# Patient Record
Sex: Female | Born: 1935 | Hispanic: Yes | Smoking: Never smoker
Health system: Southern US, Community
[De-identification: ages and names within clinical notes are randomized; demographics above are authoritative.]

## PROBLEM LIST (undated history)

## (undated) DIAGNOSIS — I10 Essential (primary) hypertension: Secondary | ICD-10-CM

---

## 2019-11-22 ENCOUNTER — Emergency Department (HOSPITAL_COMMUNITY): Payer: Self-pay

## 2019-11-22 ENCOUNTER — Inpatient Hospital Stay (HOSPITAL_COMMUNITY)
Admission: EM | Admit: 2019-11-22 | Discharge: 2019-11-24 | DRG: 087 | Disposition: A | Discharge status: Rehabilitation Facility | Payer: Self-pay | Attending: Internal Medicine | Admitting: Internal Medicine

## 2019-11-22 ENCOUNTER — Encounter (HOSPITAL_COMMUNITY): Payer: Self-pay | Admitting: Internal Medicine

## 2019-11-22 DIAGNOSIS — I10 Essential (primary) hypertension: Secondary | ICD-10-CM | POA: Diagnosis present

## 2019-11-22 DIAGNOSIS — S0101XA Laceration without foreign body of scalp, initial encounter: Secondary | ICD-10-CM | POA: Diagnosis present

## 2019-11-22 DIAGNOSIS — Z79899 Other long term (current) drug therapy: Secondary | ICD-10-CM

## 2019-11-22 DIAGNOSIS — W108XXA Fall (on) (from) other stairs and steps, initial encounter: Secondary | ICD-10-CM

## 2019-11-22 DIAGNOSIS — S065XAA Traumatic subdural hemorrhage with loss of consciousness status unknown, initial encounter: Secondary | ICD-10-CM | POA: Diagnosis present

## 2019-11-22 DIAGNOSIS — Z23 Encounter for immunization: Secondary | ICD-10-CM

## 2019-11-22 DIAGNOSIS — H532 Diplopia: Secondary | ICD-10-CM | POA: Diagnosis present

## 2019-11-22 DIAGNOSIS — R402142 Coma scale, eyes open, spontaneous, at arrival to emergency department: Secondary | ICD-10-CM | POA: Diagnosis present

## 2019-11-22 DIAGNOSIS — W109XXA Fall (on) (from) unspecified stairs and steps, initial encounter: Secondary | ICD-10-CM | POA: Diagnosis present

## 2019-11-22 DIAGNOSIS — Z20822 Contact with and (suspected) exposure to covid-19: Secondary | ICD-10-CM | POA: Diagnosis present

## 2019-11-22 DIAGNOSIS — K219 Gastro-esophageal reflux disease without esophagitis: Secondary | ICD-10-CM | POA: Diagnosis present

## 2019-11-22 DIAGNOSIS — M4316 Spondylolisthesis, lumbar region: Secondary | ICD-10-CM | POA: Diagnosis present

## 2019-11-22 DIAGNOSIS — R402362 Coma scale, best motor response, obeys commands, at arrival to emergency department: Secondary | ICD-10-CM | POA: Diagnosis present

## 2019-11-22 DIAGNOSIS — S065X1A Traumatic subdural hemorrhage with loss of consciousness of 30 minutes or less, initial encounter: Principal | ICD-10-CM | POA: Diagnosis present

## 2019-11-22 DIAGNOSIS — R32 Unspecified urinary incontinence: Secondary | ICD-10-CM | POA: Diagnosis present

## 2019-11-22 DIAGNOSIS — D649 Anemia, unspecified: Secondary | ICD-10-CM | POA: Diagnosis present

## 2019-11-22 DIAGNOSIS — R402252 Coma scale, best verbal response, oriented, at arrival to emergency department: Secondary | ICD-10-CM | POA: Diagnosis present

## 2019-11-22 DIAGNOSIS — G629 Polyneuropathy, unspecified: Secondary | ICD-10-CM | POA: Diagnosis present

## 2019-11-22 LAB — CBC WITH DIFFERENTIAL/PLATELET
Abs Immature Granulocytes: 0.03 10*3/uL (ref 0.00–0.07)
Basophils Absolute: 0 10*3/uL (ref 0.0–0.1)
Basophils Relative: 0 %
Eosinophils Absolute: 0.1 10*3/uL (ref 0.0–0.5)
Eosinophils Relative: 1 %
HCT: 35.8 % — ABNORMAL LOW (ref 36.0–46.0)
Hemoglobin: 10.8 g/dL — ABNORMAL LOW (ref 12.0–15.0)
Immature Granulocytes: 0 %
Lymphocytes Relative: 16 %
Lymphs Abs: 1.5 10*3/uL (ref 0.7–4.0)
MCH: 29.5 pg (ref 26.0–34.0)
MCHC: 30.2 g/dL (ref 30.0–36.0)
MCV: 97.8 fL (ref 80.0–100.0)
Monocytes Absolute: 0.5 10*3/uL (ref 0.1–1.0)
Monocytes Relative: 6 %
Neutro Abs: 7.4 10*3/uL (ref 1.7–7.7)
Neutrophils Relative %: 77 %
Platelets: 307 10*3/uL (ref 150–400)
RBC: 3.66 MIL/uL — ABNORMAL LOW (ref 3.87–5.11)
RDW: 13.7 % (ref 11.5–15.5)
WBC: 9.5 10*3/uL (ref 4.0–10.5)
nRBC: 0 % (ref 0.0–0.2)

## 2019-11-22 LAB — BASIC METABOLIC PANEL
Anion gap: 8 (ref 5–15)
BUN: 15 mg/dL (ref 8–23)
CO2: 23 mmol/L (ref 22–32)
Calcium: 8.3 mg/dL — ABNORMAL LOW (ref 8.9–10.3)
Chloride: 108 mmol/L (ref 98–111)
Creatinine, Ser: 0.79 mg/dL (ref 0.44–1.00)
GFR calc Af Amer: >60 mL/min (ref >60)
GFR calc non Af Amer: >60 mL/min (ref >60)
Glucose, Bld: 123 mg/dL — ABNORMAL HIGH (ref 70–99)
Potassium: 3.6 mmol/L (ref 3.5–5.1)
Sodium: 139 mmol/L (ref 135–145)

## 2019-11-22 LAB — SARS CORONAVIRUS 2 BY RT PCR (HOSPITAL ORDER, PERFORMED IN ~~LOC~~ HOSPITAL LAB): SARS Coronavirus 2: NEGATIVE

## 2019-11-22 MED ORDER — SODIUM CHLORIDE 0.9 % IV SOLN: INTRAVENOUS | Status: DC

## 2019-11-22 MED ORDER — ACETAMINOPHEN 650 MG RE SUPP: 650.0000 mg | Freq: Four times a day (QID) | RECTAL | Status: DC | PRN

## 2019-11-22 MED ORDER — CLEVIDIPINE BUTYRATE 0.5 MG/ML IV EMUL: 0.0000 mg/h | INTRAVENOUS | Status: DC

## 2019-11-22 MED ORDER — MORPHINE SULFATE (PF) 4 MG/ML IV SOLN
4.0000 mg | Freq: Once | INTRAVENOUS | Status: AC
  Administered 2019-11-22: 4 mg via INTRAVENOUS
  Filled 2019-11-22: qty 1

## 2019-11-22 MED ORDER — TETANUS-DIPHTH-ACELL PERTUSSIS 5-2.5-18.5 LF-MCG/0.5 IM SUSP
0.5000 mL | Freq: Once | INTRAMUSCULAR | Status: AC
  Administered 2019-11-22: 0.5 mL via INTRAMUSCULAR
  Filled 2019-11-22: qty 0.5

## 2019-11-22 MED ORDER — ACETAMINOPHEN 325 MG PO TABS
650.0000 mg | ORAL_TABLET | Freq: Four times a day (QID) | ORAL | Status: DC | PRN
  Administered 2019-11-22 – 2019-11-23 (×2): 650 mg via ORAL
  Filled 2019-11-22 (×2): qty 2

## 2019-11-22 MED ORDER — LOSARTAN POTASSIUM 50 MG PO TABS
50.0000 mg | ORAL_TABLET | Freq: Every day | ORAL | Status: DC
  Administered 2019-11-23 – 2019-11-24 (×2): 50 mg via ORAL
  Filled 2019-11-22 (×2): qty 1

## 2019-11-22 MED ORDER — LIDOCAINE-EPINEPHRINE 1 %-1:100000 IJ SOLN
20.0000 mL | Freq: Once | INTRAMUSCULAR | Status: AC
  Administered 2019-11-22: 20 mL
  Filled 2019-11-22: qty 1

## 2019-11-22 MED ORDER — OXYCODONE HCL 5 MG PO TABS
5.0000 mg | ORAL_TABLET | ORAL | Status: DC | PRN
  Administered 2019-11-22 – 2019-11-24 (×4): 5 mg via ORAL
  Filled 2019-11-22 (×4): qty 1

## 2019-11-22 NOTE — Consult Note (Signed)
Reason for Consult:traumatic  Referring Physician: ed  Cynthia Foley is an 86 y.o. female.  HPI: whom fell this morning down 10-15 flights of steps. Found at base of stairs, bleeding from scalp, confused. No recollection of the fall. BP was elevated, transported to [facility] ED. Head CT showed small subdural hematoma on left side.  No past medical history on file.  Social History   Socioeconomic History  . Marital status: Single    Spouse name: Not on file  . Number of children: Not on file  . Years of education: Not on file  . Highest education level: Not on file  Occupational History  . Not on file  Tobacco Use  . Smoking status: Not on file  Substance and Sexual Activity  . Alcohol use: Not on file  . Drug use: Not on file  . Sexual activity: Not on file  Other Topics Concern  . Not on file  Social History Narrative  . Not on file   Social Determinants of Health   Financial Resource Strain:   . Difficulty of Paying Living Expenses: Not on file  Food Insecurity:   . Worried About Programme researcher, broadcasting/film/video in the Last Year: Not on file  . Ran Out of Food in the Last Year: Not on file  Transportation Needs:   . Lack of Transportation (Medical): Not on file  . Lack of Transportation (Non-Medical): Not on file  Physical Activity:   . Days of Exercise per Week: Not on file  . Minutes of Exercise per Session: Not on file  Stress:   . Feeling of Stress : Not on file  Social Connections:   . Frequency of Communication with Friends and Family: Not on file  . Frequency of Social Gatherings with Friends and Family: Not on file  . Attends Religious Services: Not on file  . Active Member of Clubs or Organizations: Not on file  . Attends Banker Meetings: Not on file  . Marital Status: Not on file  Intimate Partner Violence:   . Fear of Current or Ex-Partner: Not on file  . Emotionally Abused: Not on file  . Physically Abused: Not on file  . Sexually Abused: Not on file     No family history on file.  Social History:  has no history on file for tobacco use, alcohol use, and drug use.  Allergies: Not on File  Medications: I have reviewed the patient's current medications.  Results for orders placed or performed during the hospital encounter of 11/22/19 (from the past 48 hour(s))  Basic metabolic panel     Status: Abnormal   Collection Time: 11/22/19  9:37 AM  Result Value Ref Range   Sodium 139 135 - 145 mmol/L   Potassium 3.6 3.5 - 5.1 mmol/L   Chloride 108 98 - 111 mmol/L   CO2 23 22 - 32 mmol/L   Glucose, Bld 123 (H) 70 - 99 mg/dL    Comment: Glucose reference range applies only to samples taken after fasting for at least 8 hours.   BUN 15 8 - 23 mg/dL   Creatinine, Ser 8.50 0.44 - 1.00 mg/dL   Calcium 8.3 (L) 8.9 - 10.3 mg/dL   GFR calc non Af Amer >60 >60 mL/min   GFR calc Af Amer >60 >60 mL/min   Anion gap 8 5 - 15    Comment: Performed at [facility], [address] [place], [place] [address]    DG Lumbar Spine  Complete  Result Date: 11/22/2019 CLINICAL DATA:  Recent fall downstairs with lower back pain, initial encounter EXAM: LUMBAR SPINE - COMPLETE 4+ VIEW COMPARISON:  None. FINDINGS: Five lumbar type vertebral bodies are well visualized. Vertebral body height is well maintained. Mild degenerative anterolisthesis of L4 on L5 is noted. Mild osteophytic changes are seen. Facet hypertrophic changes are noted. Diffuse aortic calcifications are seen without aneurysmal dilatation. IMPRESSION: Degenerative change with mild anterolisthesis of L4 on L5. Electronically Signed   By: Alcide Clever M.D.   On: 11/22/2019 08:26   DG Tibia/Fibula Left  Result Date: 11/22/2019 CLINICAL DATA:  Recent fall with ankle pain, initial encounter EXAM: LEFT TIBIA AND FIBULA - 2 VIEW COMPARISON:  None. FINDINGS: Soft tissue swelling is noted along the anterior aspect of the distal lower leg. Degenerative changes of the knee joint are seen. No acute fracture  or dislocation is noted. IMPRESSION: Degenerative change without acute bony abnormality. Mild distal soft tissue swelling is noted related to the recent injury. Electronically Signed   By: Alcide Clever M.D.   On: 11/22/2019 08:25   CT HEAD WO CONTRAST  Result Date: 11/22/2019 CLINICAL DATA:  Fall down stairs.  Posterior head laceration. EXAM: CT HEAD WITHOUT CONTRAST TECHNIQUE: Contiguous axial images were obtained from the base of the skull through the vertex without intravenous contrast. COMPARISON:  None. FINDINGS: Brain: There is an extra-axial hyperdense fluid collection noted in the left parietal region best seen on coronal imaging measuring up to 7 mm in thickness compatible with subdural hematoma. No mass effect or midline shift. No intra parenchymal hemorrhage. Age related volume loss. No hydrocephalus. Vascular: No hyperdense vessel or unexpected calcification. Skull: No acute calvarial abnormality. Sinuses/Orbits: Visualized paranasal sinuses and mastoids clear. Orbital soft tissues unremarkable. Other: Soft tissue swelling and scalp laceration noted in the posterior left parietal region. IMPRESSION: 7 mm left subdural hematoma in the left parietal region. No mass effect or midline shift. Critical Value/emergent results were called by telephone at the time of interpretation on 11/22/2019 at 8:44 am to provider [facility] , who verbally acknowledged these results. Electronically Signed   By: Charlett Nose M.D.   On: 11/22/2019 08:46   CT CERVICAL SPINE WO CONTRAST  Result Date: 11/22/2019 CLINICAL DATA:  Fall down steps.  Hit head. EXAM: CT CERVICAL SPINE WITHOUT CONTRAST TECHNIQUE: Multidetector CT imaging of the cervical spine was performed without intravenous contrast. Multiplanar CT image reconstructions were also generated. COMPARISON:  None. FINDINGS: Alignment: Degenerative listhesis noted at C3-4 and C4-5. Skull base and vertebrae: No acute fracture. No primary bone lesion or focal pathologic  process. Soft tissues and spinal canal: No prevertebral fluid or swelling. No visible canal hematoma. Disc levels: Diffuse degenerative disc disease. Advanced diffuse degenerative facet disease. Upper chest: Biapical scarring. Other: Negative IMPRESSION: Degenerative disc and facet disease.  No acute bony abnormality. Electronically Signed   By: Charlett Nose M.D.   On: 11/22/2019 08:47    Review of Systems  Constitutional: Negative.   HENT: Negative.   Eyes: Negative.   Respiratory: Negative.   Cardiovascular: Negative.   Gastrointestinal: Negative.   Endocrine: Negative.   Genitourinary: Negative.   Musculoskeletal: Negative.   Skin: Negative.   Allergic/Immunologic: Negative.   Neurological: Negative.   Hematological: Negative.    Blood pressure (!) 153/65, pulse 78, temperature 98.6 F (37 C), temperature source Oral, resp. rate 11, SpO2 97 %. Physical Exam Constitutional:      Appearance: Normal appearance.  HENT:  Head:     Comments: Laceration left scalp    Right Ear: Tympanic membrane normal.     Left Ear: Tympanic membrane normal.     Mouth/Throat:     Mouth: Mucous membranes are dry.  Eyes:     Extraocular Movements: Extraocular movements intact.     Conjunctiva/sclera: Conjunctivae normal.     Pupils: Pupils are equal, round, and reactive to light.  Cardiovascular:     Rate and Rhythm: Normal rate and regular rhythm.     Pulses: Normal pulses.     Heart sounds: Normal heart sounds.  Pulmonary:     Effort: Pulmonary effort is normal.     Breath sounds: Normal breath sounds.  Abdominal:     General: Abdomen is flat.  Musculoskeletal:        General: Normal range of motion.     Cervical back: Normal range of motion.  Skin:    General: Skin is warm and dry.  Neurological:     General: No focal deficit present.     Mental Status: She is alert and oriented to person, place, and time.     Cranial Nerves: No cranial nerve deficit.     Sensory: No sensory  deficit.     Motor: No weakness.     Coordination: Coordination normal.  Psychiatric:        Mood and Affect: Mood normal.        Behavior: Behavior normal.        Thought Content: Thought content normal.        Judgment: Judgment normal.     Assessment/Plan: Trevor Duty is a 86 y.o. female Whom fell down stairs lacerating her scalp and developing a small subdural hematoma with a normal neurological exam. Should be admitted to hospitalist service for Blood pressure control. No operative intervention indicated. Repeat film in am.   Coletta Memos 11/22/2019, 10:18 AM

## 2019-11-22 NOTE — Hospital Course (Addendum)
Cynthia Foley is a 86 yo female with a PMH of peripheral neuropathy 2/2 low Vit B12, GERD, and HTN. Patient is from [place] and visiting family in [place] this month. She presented to the hospital after a fall down 10-15 stairs at her son's home after coming from the bathroom in the dark.  Subdural hematoma following a likely mechanical fall Upon presentation to ED neurological exam was intact. Initially Cleviprex was ordered due to patient HTN; however it resolved on its own and the medication was not necessary. XR lumbar spine was only significant for degenerative change with mild anterolisthesis of L4 and L5. CT cervical spine was not significant for an acute bony abnormality. CT head revealed 67mm subdural hematoma in the left parietal region with no midline shift or mass effect noted. Repeat CT in the AM showed no change. Neurosurgery evaluated the patient and did not feel need for surgical intervention. Patient continued to have stable vitals and exam. Patient did have nausea associated with meals. Patient was provided relief with Zofran and also found lemons helpful. She was able to eat without nausea on day of discharge. Patient initally had double vision when sitting up but this went away as well. Patient continued to have photophobia from bright light. PT and OT evalauted patient. They both concluded that patient would need 24/7 supervision. Patient family confirmed that they will make sure that someone is always with her and watching her. Patient family was educated on making the house safe in relation to patient's recent fall. PT and OT also thought patient would require a walker. Patient was taught how to use the walker and feel comfortable at this time. Patient's son bought the patient a walker to use at his home.   Scalp laceration: Patient was noted to have a large stellate laceration that was actively bleeding. Patient had staples placed to close the wound. Wound was covered with gauze.  Wound was cleaned in the hospital and family was also given instructions on how to keep the wound clean at home.   L leg abrasion Patient was noted to have minimal abrasions to her left shin. Bandage was placed over the leg. X ray of the Left tibia/ fibula noted mild soft tissue swelling related to the recent injury.   Patient remained tender to palpation. Bandage was removed the area appears to be healing well.  Hypertension Patient was continued on her home losartan dose of 50mg .   Normocytic anemia Due to fall trended Hgb. There was initial drop 1.7 expected due to patient's blood loss from her head injury.Hgb then stabilized.

## 2019-11-22 NOTE — ED Notes (Signed)
Rep[ort called to rn on 3w amy rn

## 2019-11-22 NOTE — ED Notes (Signed)
Pt does not speak english  Sone at her bedside does speak english  He has brought her food to eat  Pt alert and oriented

## 2019-11-22 NOTE — ED Triage Notes (Signed)
Pt presents to ED BIB [facility] from home. Pt c/o fall down 10-15 stairs this morning. Pt initially confused but upon EMS arrival pt AAO at baseline. EMS placed pt in C-collar. EMS VS HTN   180/110 HR - 70 RR - 18 97% RA

## 2019-11-22 NOTE — H&P (Signed)
Date: 11/22/2019               Patient Name:  Cynthia Foley MRN: [id number]  DOB: Dec 29, 1930 Age / Sex: 86 y.o., female   PCP: Patient, No Pcp Per         Medical Service: Internal Medicine Teaching Service         Attending Physician: Dr. Mayford Knife, Dorene Ar, MD    First Contact: Dr. Morrie Sheldon Pager: [phone]  Second Contact: Dr. Marchia Bond Pager: [phone]       After Hours (After 5p/  First Contact Pager: [phone]  weekends / holidays): Second Contact Pager: [phone]   Chief Complaint: Fall  History of Present Illness: This is a 86 year old female with a history of HTN and GERD who is presenting after a fall at home. She is visiting from [place] with her son for the past 2 weeks and this morning she was trying to find the bathroom in the dark and fell down about 10-15 steps. Son at bedside. Interpretor used.  She states she did not have any lightheadedness, dizziness, chest pain, palpitations, fever, chills, nausea, vomiting, and no loss of consciousness.  Denies any vision issues, weakness, numbness, headaches, or other's.  She feels like she just tripped in the dark fell down the stairs.  States that she hit her head and her left leg.  Son reports that they arrived shortly after she fell, she initially seems somewhat confused however after a few seconds she started looking around and was more alert.  She does state she has been having some bladder incontinence since April 2021, she feels herself urinating and she does not mean to.  Denies any dysuria, hematuria, increased frequency, urge, or other symptoms.  She has not been evaluated for this.  She does endorse sensitivity to milk, reports having loose bowel movements when she drinks milk.  In the ER patient was noted to be afebrile, initially hypertensive up to 190/102, this improved to 130s over 50s with no intervention.  BMP showed mild increase in glucose at 123.  Covid negative.  CBC showed hemoglobin 10.8, unclear baseline.  CT head showed 7  mm left subdural hematoma in the left parietal region, no mass-effect or midline shift.  CT cervical spine without contrast showed degenerative disc disease and facet disease.  Left tibia/fibula x-ray showed mild distal soft tissue swelling.  Lumbar spine x-ray showed degenerative changes mild anterolisthesis of L4 on L5.  EKG showed normal sinus rhythm, no ST changes.  Initially a Cleviprex drip was ordered however given improvement in blood pressure with no intervention this was not given.  Neurosurgery was consulted, patient had normal neurological exam, no operative intervention indicated, repeat imaging in a.m.  Patient admitted to IMTS for further management.  Meds:  Current Meds  Medication Sig  . losartan (COZAAR) 50 MG tablet Take 50 mg by mouth at bedtime.  Marland Kitchen OVER THE COUNTER MEDICATION Take 1 tablet by mouth daily. Neurobion; Vitamins B1 + B6 + B12 all in one tablet.    Allergies: Allergies as of 11/22/2019 - Review Complete 11/22/2019  Allergen Reaction Noted  . Lactose intolerance (gi) Other (See Comments) 11/22/2019   No past medical history on file.  Family History: Mother and father had HTN.  Social History: Denies EtOH, smoking, and drug use. Lives in [place] with her daughter, currently visiting her son in [place].   Review of Systems: A complete ROS was negative except as per HPI.   Physical  Exam: Blood pressure 137/60, pulse 81, temperature 98.6 F (37 C), temperature source Oral, resp. rate 13, SpO2 95 %. Physical Exam Constitutional:      Appearance: Normal appearance.  HENT:     Head: Normocephalic.     Comments: Left occipital dried blood, multiple stiches placed    Mouth/Throat:     Mouth: Mucous membranes are moist.     Pharynx: Oropharynx is clear.  Eyes:     Extraocular Movements: Extraocular movements intact.     Conjunctiva/sclera: Conjunctivae normal.     Pupils: Pupils are equal, round, and reactive to light.  Cardiovascular:     Rate and Rhythm: Normal  rate and regular rhythm.     Pulses: Normal pulses.     Heart sounds: Normal heart sounds.  Pulmonary:     Effort: Pulmonary effort is normal.     Breath sounds: Normal breath sounds.  Abdominal:     General: Abdomen is flat. Bowel sounds are normal.     Palpations: Abdomen is soft.  Musculoskeletal:        General: No swelling. Normal range of motion.  Skin:    General: Skin is warm.     Capillary Refill: Capillary refill takes less than 2 seconds.     Findings: Bruising (Bruising noted on left shin, small, non bleeding laceration) present.  Neurological:     General: No focal deficit present.     Mental Status: She is alert and oriented to person, place, and time.     Cranial Nerves: No cranial nerve deficit.     Sensory: No sensory deficit.     Motor: No weakness.  Psychiatric:        Mood and Affect: Mood normal.        Behavior: Behavior normal.     EKG: personally reviewed my interpretation is normal sinus rhythm, no acute ST changes or T wave inversions  Left tibia/fibula x-ray: IMPRESSION: Degenerative change without acute bony abnormality.  Mild distal soft tissue swelling is noted related to the recent injury.   CT head: IMPRESSION: 7 mm left subdural hematoma in the left parietal region. No mass effect or midline shift.  CT cervical spine: IMPRESSION: Degenerative disc and facet disease.  No acute bony abnormality.  Lumbar x-ray: IMPRESSION: Degenerative change with mild anterolisthesis of L4 on L5.  Assessment & Plan by Problem: Active Problems:   Subdural hematoma (HCC)  Subdural hematoma following a likely mechanical fall: This is a 86 year old female with a history of hypertension and GERD presenting after mechanical fall that occurred this morning. No presyncopal or seizure like activity noted. On exam she has a large amount of dried blood noted on occipital scalp obvious laceration.  Neurological exam intact.  CT head showed 7 mm subdural  hematoma in the left parietal region, no midline shift or mass-effect noted.  Neurosurgery evaluated, did not feel that surgical intervention is necessary at this time.  Recommended repeat imaging in a.m.  -Frequent neuro checks -Repeat CT head in a.m. -Neurosurgery following, appreciate recommendations -Telemetry -CBC and BMP in AM  Scalp laceration: Located on occipital scalp, s/p multiple stiches in place. Dried blood around area. Tenderness to palpation over area.   -Tylenol for pain control -Keep area clean and dry -Will need follow up for staple removal  Bladder incontinence: Occurs mostly at night, does not feel herself urinating. Going on since April 2021, has not been evaluated yet. Denies any urinary frequency, dysuria, hematuria. Will obtain U/A to further evaluate  for reversible causes.   -U/A -Monitor BMP -Will need follow up  Hypertension: Patient is on losartan 50 mg daily at home.  Initially patient was hypertensive up to 190/102 however this improved with no intervention.  We will resume home BP medications for blood pressure control.  -Resume home Losartan 50 mg daily  Normocytic anemia: Hgb 10.8 today, unclear baseline. Patient does have scalp laceration and subdural hematoma. Will repeat CBC in AM.   Dispo: Admit patient to Observation with expected length of stay less than 2 midnights.  Signed: Claudean Severance, MD 11/22/2019, 3:43 PM  Pager: [phone] After 5pm on weekdays and 1pm on weekends: On Call pager: [phone]

## 2019-11-22 NOTE — ED Notes (Signed)
Laceration on back of head continues to bleed, wrapped with gauze.  Pt c/o head pain and left shin pain. Small skin tears noted on left shin, bleeding controlled  CMS intact and pulses present  Pt alert and oriented. Does not appear in distress.

## 2019-11-22 NOTE — ED Provider Notes (Signed)
[facility] EMERGENCY DEPARTMENT Provider Note   CSN: [id number] Arrival date & time: 11/22/19  0636     History Chief Complaint  Patient presents with  . Fall    Kimisha Eunice is a 86 y.o. female.  The history is provided by the patient and a relative. The history is limited by a language barrier. A language interpreter was used.  Fall     86 year old female brought here via EMS from home for evaluation of a fall.  Patient is Spanish-speaking, history obtained through language interpreter.  Patient report this morning she went to use the bathroom to urinate.  She did not recall falling or had a syncopal episode but when she came to, her son was there, was very concerned and patient was brought to the hospital.  She is currently complaining of pain to the back of her head and pain to her left lower leg.  Pain to the back of her head is rated as 4 out of 10, throbbing.  Pain in the leg is moderate in severity.  She recall able to ambulate on it.  She did not know what happened and did not specifically recall falling.  She is unsure if she has a syncopal episode.  She denies any precipitating symptoms prior to the injury.  She denies having any vision changes, nausea, vomiting, neck pain, chest pain, trouble breathing, abdominal pain, back pain, any focal numbness or focal weakness.  She denies any recent medication changes.  She denies any dysuria.  She is not on any blood thinner medication.  She is on blood pressure medication but did not take it this morning.  She is up-to-date with Covid vaccination.  No past medical history on file.  There are no problems to display for this patient.   The histories are not reviewed yet. Please review them in the "History" navigator section and refresh this SmartLink.   OB History   No obstetric history on file.     No family history on file.  Social History   Tobacco Use  . Smoking status: Not on file  Substance Use  Topics  . Alcohol use: Not on file  . Drug use: Not on file    Home Medications Prior to Admission medications   Not on File    Allergies    Patient has no allergy information on record.  Review of Systems   Review of Systems  All other systems reviewed and are negative.   Physical Exam Updated Vital Signs BP (!) 190/102   Pulse 70   Temp 98.6 F (37 C) (Oral)   Resp 15   LMP  (LMP Unknown)   SpO2 99%   Physical Exam Vitals and nursing note reviewed.  Constitutional:      General: She is not in acute distress.    Appearance: She is well-developed.  HENT:     Head:     Comments: Large amounts of dried blood noted to occipital scalp with obvious laceration not fully visualized on initial inspection.  No crepitus noted.  No hemotympanum, no septal hematoma, no malocclusion, no midface tenderness. Eyes:     Extraocular Movements: Extraocular movements intact.     Conjunctiva/sclera: Conjunctivae normal.     Pupils: Pupils are equal, round, and reactive to light.  Cardiovascular:     Rate and Rhythm: Normal rate and regular rhythm.     Pulses: Normal pulses.     Heart sounds: Normal heart sounds.  Pulmonary:  Breath sounds: Normal breath sounds.  Abdominal:     Palpations: Abdomen is soft.     Tenderness: There is no abdominal tenderness.  Musculoskeletal:        General: Tenderness (Left lower leg: Tenderness to mid tib-fib anteriorly with skin abrasion noted.  No crepitus.) present.     Cervical back: Normal range of motion and neck supple. No rigidity or tenderness.     Comments: No significant midline spine tenderness crepitus or step-off.  Skin:    Findings: No rash.  Neurological:     Mental Status: She is alert and oriented to person, place, and time.     GCS: GCS eye subscore is 4. GCS verbal subscore is 5. GCS motor subscore is 6.     Cranial Nerves: Cranial nerves are intact.     Sensory: Sensation is intact.     Motor: Motor function is intact.    Psychiatric:        Mood and Affect: Mood normal.     ED Results / Procedures / Treatments   Labs (all labs ordered are listed, but only abnormal results are displayed) Labs Reviewed  BASIC METABOLIC PANEL - Abnormal; Notable for the following components:      Result Value   Glucose, Bld 123 (*)    Calcium 8.3 (*)    All other components within normal limits  CBC WITH DIFFERENTIAL/PLATELET - Abnormal; Notable for the following components:   RBC 3.66 (*)    Hemoglobin 10.8 (*)    HCT 35.8 (*)    All other components within normal limits  SARS CORONAVIRUS 2 BY RT PCR (HOSPITAL ORDER, PERFORMED IN [facility] [facility])  CBC WITH DIFFERENTIAL/PLATELET  URINALYSIS, ROUTINE W REFLEX MICROSCOPIC  CBG MONITORING, ED    EKG EKG Interpretation  Date/Time:  Wednesday November 22 2019 07:37:34 EDT Ventricular Rate:  74 PR Interval:    QRS Duration: 103 QT Interval:  421 QTC Calculation: 468 R Axis:   11 Text Interpretation: Sinus rhythm RSR' in V1 or V2, right VCD or RVH No old tracing to compare Confirmed by Melene PlanFloyd, Dan [phone]([id number]) on 11/22/2019 8:00:12 AM Also confirmed by Melene PlanFloyd, Dan [phone]), editor Elita QuickWatlington, Beverly ([id number])  on 11/22/2019 10:32:39 AM   Radiology DG Lumbar Spine Complete  Result Date: 11/22/2019 CLINICAL DATA:  Recent fall downstairs with lower back pain, initial encounter EXAM: LUMBAR SPINE - COMPLETE 4+ VIEW COMPARISON:  None. FINDINGS: Five lumbar type vertebral bodies are well visualized. Vertebral body height is well maintained. Mild degenerative anterolisthesis of L4 on L5 is noted. Mild osteophytic changes are seen. Facet hypertrophic changes are noted. Diffuse aortic calcifications are seen without aneurysmal dilatation. IMPRESSION: Degenerative change with mild anterolisthesis of L4 on L5. Electronically Signed   By: Alcide CleverMark  Lukens M.D.   On: 11/22/2019 08:26   DG Tibia/Fibula Left  Result Date: 11/22/2019 CLINICAL DATA:  Recent fall with ankle pain,  initial encounter EXAM: LEFT TIBIA AND FIBULA - 2 VIEW COMPARISON:  None. FINDINGS: Soft tissue swelling is noted along the anterior aspect of the distal lower leg. Degenerative changes of the knee joint are seen. No acute fracture or dislocation is noted. IMPRESSION: Degenerative change without acute bony abnormality. Mild distal soft tissue swelling is noted related to the recent injury. Electronically Signed   By: Alcide CleverMark  Lukens M.D.   On: 11/22/2019 08:25   CT HEAD WO CONTRAST  Result Date: 11/22/2019 CLINICAL DATA:  Fall down stairs.  Posterior head laceration. EXAM: CT HEAD WITHOUT  CONTRAST TECHNIQUE: Contiguous axial images were obtained from the base of the skull through the vertex without intravenous contrast. COMPARISON:  None. FINDINGS: Brain: There is an extra-axial hyperdense fluid collection noted in the left parietal region best seen on coronal imaging measuring up to 7 mm in thickness compatible with subdural hematoma. No mass effect or midline shift. No intra parenchymal hemorrhage. Age related volume loss. No hydrocephalus. Vascular: No hyperdense vessel or unexpected calcification. Skull: No acute calvarial abnormality. Sinuses/Orbits: Visualized paranasal sinuses and mastoids clear. Orbital soft tissues unremarkable. Other: Soft tissue swelling and scalp laceration noted in the posterior left parietal region. IMPRESSION: 7 mm left subdural hematoma in the left parietal region. No mass effect or midline shift. Critical Value/emergent results were called by telephone at the time of interpretation on 11/22/2019 at 8:44 am to provider [facility] , who verbally acknowledged these results. Electronically Signed   By: Charlett Nose M.D.   On: 11/22/2019 08:46   CT CERVICAL SPINE WO CONTRAST  Result Date: 11/22/2019 CLINICAL DATA:  Fall down steps.  Hit head. EXAM: CT CERVICAL SPINE WITHOUT CONTRAST TECHNIQUE: Multidetector CT imaging of the cervical spine was performed without intravenous contrast.  Multiplanar CT image reconstructions were also generated. COMPARISON:  None. FINDINGS: Alignment: Degenerative listhesis noted at C3-4 and C4-5. Skull base and vertebrae: No acute fracture. No primary bone lesion or focal pathologic process. Soft tissues and spinal canal: No prevertebral fluid or swelling. No visible canal hematoma. Disc levels: Diffuse degenerative disc disease. Advanced diffuse degenerative facet disease. Upper chest: Biapical scarring. Other: Negative IMPRESSION: Degenerative disc and facet disease.  No acute bony abnormality. Electronically Signed   By: Charlett Nose M.D.   On: 11/22/2019 08:47    Procedures .Marland KitchenLaceration Repair  Date/Time: 11/22/2019 9:26 AM Performed by: Fayrene Helper, PA-C Authorized by: Fayrene Helper, PA-C   Consent:    Consent obtained:  Verbal   Consent given by:  Patient   Risks discussed:  Infection, need for additional repair, pain, poor cosmetic result and poor wound healing   Alternatives discussed:  No treatment and delayed treatment Universal protocol:    Procedure explained and questions answered to patient or proxy's satisfaction: yes     Relevant documents present and verified: yes     Test results available and properly labeled: yes     Imaging studies available: yes     Required blood products, implants, devices, and special equipment available: yes     Site/side marked: yes     Immediately prior to procedure, a time out was called: yes     Patient identity confirmed:  Verbally with patient Anesthesia (see MAR for exact dosages):    Anesthesia method:  Local infiltration   Local anesthetic:  Lidocaine 2% WITH epi Laceration details:    Location:  Scalp   Scalp location:  L parietal   Length (cm):  9   Depth (mm):  5 Repair type:    Repair type:  Complex Pre-procedure details:    Preparation:  Imaging obtained to evaluate for foreign bodies Exploration:    Limited defect created (wound extended): no     Hemostasis achieved with:   Direct pressure and epinephrine   Wound exploration: wound explored through full range of motion     Wound extent: vascular damage     Wound extent: no foreign bodies/material noted and no underlying fracture noted     Contaminated: no   Treatment:    Area cleansed with:  Betadine and saline  Amount of cleaning:  Extensive   Irrigation solution:  Sterile saline   Visualized foreign bodies/material removed: no     Debridement:  None Skin repair:    Repair method:  Staples   Number of staples:  8 Approximation:    Approximation:  Loose Post-procedure details:    Dressing:  Bulky dressing   Patient tolerance of procedure:  Tolerated with difficulty  .Critical Care Performed by: Fayrene Helper, PA-C Authorized by: Fayrene Helper, PA-C   Critical care provider statement:    Critical care time (minutes):  45   Critical care was time spent personally by me on the following activities:  Discussions with consultants, evaluation of patient's response to treatment, examination of patient, ordering and performing treatments and interventions, ordering and review of laboratory studies, ordering and review of radiographic studies, pulse oximetry, re-evaluation of patient's condition, obtaining history from patient or surrogate and review of old charts   (including critical care time)  Medications Ordered in ED Medications  0.9 %  sodium chloride infusion ( Intravenous New Bag/Given 11/22/19 0917)  clevidipine (CLEVIPREX) infusion 0.5 mg/mL (0 mg/hr Intravenous Hold 11/22/19 1145)  lidocaine-EPINEPHrine (XYLOCAINE W/EPI) 1 %-1:100000 (with pres) injection 20 mL (20 mLs Infiltration Given by Other 11/22/19 0910)  Tdap (BOOSTRIX) injection 0.5 mL (0.5 mLs Intramuscular Given 11/22/19 0941)  morphine 4 MG/ML injection 4 mg (4 mg Intravenous Given 11/22/19 0905)  morphine 4 MG/ML injection 4 mg (4 mg Intravenous Given 11/22/19 1209)    ED Course  I have reviewed the triage vital signs and the nursing  notes.  Pertinent labs & imaging results that were available during my care of the patient were reviewed by me and considered in my medical decision making (see chart for details).    MDM Rules/Calculators/A&P                          BP (!) 134/56   Pulse 80   Temp 98.6 F (37 C) (Oral)   Resp 10   LMP  (LMP Unknown)   SpO2 96%   Final Clinical Impression(s) / ED Diagnoses Final diagnoses:  Traumatic subdural hemorrhage with loss of consciousness of 30 minutes or less, initial encounter (HCC)  Laceration of scalp, initial encounter  Fall down stairs, initial encounter    Rx / DC Orders ED Discharge Orders    None     7:50 AM I was able to obtain history through patient's sons over the phone.  It appears patient recently visit her son and have been staying at his house for the past 2 weeks.  She was trying to find the bathroom in the dark this morning and subsequently was heard falling down approximately 10-15 flights of steps.  She was found on the bottom of the floor, appears confused for several minutes but subsequently returned back to her baseline.  She did not recall the fall.  She does suffer head injury and also has abrasion to her left lower leg with tenderness.  Just bleeding this at this time, I suspect her fall is likely mechanical, however due to her age, work-up initiated.  She does have history of high blood pressure and is currently on blood pressure medication.  She was found to be hypertensive with a current blood pressure of 190/102.  I suspect this is likely secondary to underlying blood pressure as well as recent injury.  No history of seizures.  No report of any precipitating symptoms prior to  the fall.  Work-up initiated. Care discussed with Dr. Adela Lank.   8:48 AM Patient has a large stellate laceration on the scalp actively bleeding.  Attempt to provide hemostasis by irrigation, clot removal, and also applied surgical staple to the scalp with hemostasis.  Head CT  scan obtained demonstrate a 7 mm left subdural hematoma in the left parietal region without any mass-effect or midline shift.  Initial blood pressure was elevated at 190/102, on recheck it is 156 systolic.  We will have Cleviprex available for BP control if needed.  I have consulted neurosurgeon Dr. Franky Macho who agrees to see pt. at this time patient is mentating appropriately, she has no focal neuro deficit on exam and her cervical spine CT is unremarkable.  1:21 PM Appreciate consultation from internal medicine resident who agrees to see admit patient for observation and to get repeat head CT scan tomorrow.  At this time patient is stable and sleeping.  Family is aware of plan.   Fayrene Helper, PA-C 11/22/19 1323    Melene Plan, DO 11/22/19 1356

## 2019-11-23 ENCOUNTER — Observation Stay (HOSPITAL_COMMUNITY): Payer: Self-pay

## 2019-11-23 ENCOUNTER — Other Ambulatory Visit: Payer: Self-pay

## 2019-11-23 LAB — BASIC METABOLIC PANEL
Anion gap: 8 (ref 5–15)
BUN: 11 mg/dL (ref 8–23)
CO2: 23 mmol/L (ref 22–32)
Calcium: 8.5 mg/dL — ABNORMAL LOW (ref 8.9–10.3)
Chloride: 109 mmol/L (ref 98–111)
Creatinine, Ser: 0.71 mg/dL (ref 0.44–1.00)
GFR calc Af Amer: >60 mL/min (ref >60)
GFR calc non Af Amer: >60 mL/min (ref >60)
Glucose, Bld: 151 mg/dL — ABNORMAL HIGH (ref 70–99)
Potassium: 4.2 mmol/L (ref 3.5–5.1)
Sodium: 140 mmol/L (ref 135–145)

## 2019-11-23 LAB — CBC
HCT: 28.9 % — ABNORMAL LOW (ref 36.0–46.0)
Hemoglobin: 9.1 g/dL — ABNORMAL LOW (ref 12.0–15.0)
MCH: 30.3 pg (ref 26.0–34.0)
MCHC: 31.5 g/dL (ref 30.0–36.0)
MCV: 96.3 fL (ref 80.0–100.0)
Platelets: 253 10*3/uL (ref 150–400)
RBC: 3 MIL/uL — ABNORMAL LOW (ref 3.87–5.11)
RDW: 14 % (ref 11.5–15.5)
WBC: 5.8 10*3/uL (ref 4.0–10.5)
nRBC: 0 % (ref 0.0–0.2)

## 2019-11-23 MED ORDER — ONDANSETRON HCL 4 MG/2ML IJ SOLN
4.0000 mg | Freq: Once | INTRAMUSCULAR | Status: AC
  Administered 2019-11-23: 4 mg via INTRAVENOUS
  Filled 2019-11-23: qty 2

## 2019-11-23 NOTE — Progress Notes (Addendum)
Occupational Therapy Evaluation  PTA, pt lived in [place] and was independent with ADL and mobility using a straight cane. Pt here visiting family. [employer] interpreter used during session with daughter present. Pt able to mobilize OOB @ modified independent level then ambulate to bathroom with minguard A @ RW level. Pt hurrying to bathroom, however that was due to urinary urgency and incontinence. After transferring to toilet, pt leaning forward to wipe urine from floor. Increased time spent with pt/daughter regarding strategies to reduce risk of falls. Recommend 22 S with HH services if available. Pt will need 3in1 and pediatric RW for safe DC home. Will follow acutely.  Discussed with nsg and MD.     11/23/19 1600  OT Visit Information  Last OT Received On 11/23/19  Assistance Needed +1  History of Present Illness This is a 86 year old female with a history of HTN presenting after a mechanical fall down stairs that occurred after walking to the bathroom at night. CT revealed 7 mm subdural hematoma, no midline shift, mass effect or neurological deficits noted.  Precautions  Precautions Fall  Home Living  Family/patient expects to be discharged to: Private residence  Living Arrangements Children  Available Help at Discharge Family  Type of Home House  Home Access Stairs to enter  Entrance Stairs-Number of Steps 2  Entrance Stairs-Rails None  Home Layout Two level;Bed/bath upstairs  Alternate Level Stairs-Number of Steps 10-15  Alternate Level Stairs-Rails Left  Bathroom Shower/Tub Tub/shower unit  Horticulturist, commercial Yes  Home Equipment [place] - single point  Additional Comments Pt is visiting her family and is from [place]. She is staying at her sons home. She has been sleeping in upstairs bedroom.   Prior Function  Level of Independence Independent with assistive device(s)  Comments pt is independent with SPC for mobility at baseline.   Communication   Communication Prefers language other than English  Pain Assessment  Pain Assessment Faces  Faces Pain Scale 8  Pain Location headache and nausea  Pain Descriptors / Indicators Aching;Headache  Pain Intervention(s) Limited activity within patient's tolerance;Patient requesting pain meds-RN notified  Cognition  Arousal/Alertness Awake/alert  Behavior During Therapy WFL for tasks assessed/performed  Overall Cognitive Status Difficult to assess  Upper Extremity Assessment  Upper Extremity Assessment Overall WFL for tasks assessed  Lower Extremity Assessment  Lower Extremity Assessment Defer to PT evaluation  Cervical / Trunk Assessment  Cervical / Trunk Assessment Kyphotic  ADL  Overall ADL's  Needs assistance/impaired  Functional mobility during ADLs Min guard;Rolling walker;Cueing for safety  General ADL Comments overall minguard A with LB ADL adn set up for UB ADL , educated pt/daughter on strateiges to reduce risk of falls; encourage pt to use BSC by bed at night; use as shower chair and have direct assistance with ADL tasks; encouraged daughter/family to walk with pt using giat belt and RW; daughter verbalized understanding. Daughter asking about night time trips to the bathroom. Again encouraged to use BSC, install nightlights, not drink anything after dinnertime/decrease intake of liquids late in evening hours, possible use of briefs for night time use only. Pt/daughter verbalized understanding. Encouraged pt/daughter to remove any scatter rugs and reduce clutter on floor. Daughter states she was sleep walking and fell down stairs. Encouraged to install door/use of gait at stairs.    Vision- History  Baseline Vision/History No visual deficits  Bed Mobility  Overal bed mobility Needs Assistance (Simultaneous filing. User may not have seen previous data.)  Bed Mobility Supine to Sit;Sit to Supine (Simultaneous filing. User may not have seen previous data.)  Supine to sit HOB  elevated;Min guard (Simultaneous filing. User may not have seen previous data.)  Sit to supine HOB elevated;Min guard (Simultaneous filing. User may not have seen previous data.)  General bed mobility comments min guard for safety to sit up EOB, pt required use of bed rail and HOB elevated.  (Simultaneous filing. User may not have seen previous data.)  Transfers  Overall transfer level Needs assistance  Equipment used Rolling walker (2 wheeled);1 person hand held assist  Transfers Sit to/from Stand  Sit to Stand Min assist (Simultaneous filing. User may not have seen previous data.)  General transfer comment Min assist with 1HHA to steady with rise. pt required (Simultaneous filing. User may not have seen previous data.)  Balance  Overall balance assessment Needs assistance;History of Falls  Sitting balance-Leahy Scale Good  Standing balance-Leahy Scale Fair  OT - End of Session  Equipment Utilized During Treatment Gait belt;Rolling walker  Activity Tolerance Patient tolerated treatment well  Patient left in bed;with call bell/phone within reach;with bed alarm set;with family/visitor present  Nurse Communication Mobility status;Other (comment) (DC needs)  OT Assessment  OT Recommendation/Assessment Patient needs continued OT Services  OT Visit Diagnosis Unsteadiness on feet (R26.81);History of falling (Z91.81);Pain  Pain - part of body  (head)  OT Problem List Impaired balance (sitting and/or standing);Decreased safety awareness;Decreased knowledge of use of DME or AE;Obesity;Pain  AM-PAC OT "6 Clicks" Daily Activity Outcome Measure (Version 2)  Help from another person eating meals? 4  Help from another person taking care of personal grooming? 3  Help from another person toileting, which includes using toliet, bedpan, or urinal? 3  Help from another person bathing (including washing, rinsing, drying)? 3  Help from another person to put on and taking off regular upper body  clothing? 3  Help from another person to put on and taking off regular lower body clothing? 3  6 Click Score 19  OT Recommendation  Follow Up Recommendations Supervision/Assistance - 24 hour;Home health OT (HH if available)  OT Equipment 3 in 1 bedside commode;Other (comment) (RW)  Individuals Consulted  Consulted and Agree with Results and Recommendations Patient;Family Adult nurse (interpreter used)  Family Member Consulted daughter  Acute Rehab OT Goals  Patient Stated Goal to get something for pain and eventually return to [place]  OT Goal Formulation With patient  Time For Goal Achievement 12/07/19  Potential to Achieve Goals Good  OT Time Calculation  OT Start Time (ACUTE ONLY) 1600  OT Stop Time (ACUTE ONLY) 1655  OT Time Calculation (min) 55 min  OT General Charges  $OT Visit 1 Visit  OT Evaluation  $OT Eval Low Complexity 1 Low  OT Treatments  $Self Care/Home Management  38-52 mins  Written Expression  Dominant Hand Right  Cynthia Foley, OT/L   Acute OT Clinical Specialist Acute Rehabilitation Services Pager [phone] Office [phone]

## 2019-11-23 NOTE — Progress Notes (Signed)
Subjective: Patient reports that she did have LOC with her fall. She remembered going to her bed after getting up to use the bathroom, but all the sudden waking up in the ambulance with no idea what was going on. Patient's daughter was also in the room and spoke Spanish as well. Daughter reported that she and her mother are visiting from [place] and staying at the patient's son's house. Patient and daughter share a bed. Daughter and her brother her 2 loud noises. Daughter realized her mother was not in the bed and went out and she and her brother found her mother at the bottom of the staircase. There was a hole in the wall where she hit and "a lot of blood." Patient reports that she is feeling okay today. Reports having some pain on the left side of her head and photophobia. Patient also reports that when she sat up in the bed she began seeing double when looking at letters on the board in her room. She is otherwise in no pain. While we were evaluating patient had sudden onset nausea and had mild spit up, denied any head pain when that happened. She reports having some pain when she sits up too quickly.    Discussed that she can use tylenol as needed for head pain.   Objective:  Vital signs in last 24 hours: Vitals:   11/23/19 0341 11/23/19 0355 11/23/19 0741 11/23/19 1045  BP: (!) 134/48  (!) 124/51 (!) 138/56  Pulse: 74  76 70  Resp: 12  11 14   Temp: 98.9 F (37.2 C)  99.1 F (37.3 C) 98.5 F (36.9 C)  TempSrc: Oral  Oral Oral  SpO2: 97%  97% 99%  Weight:  75.3 kg    Height:       Physical Exam Constitutional:      Appearance: Normal appearance.  HENT:     Head:     Comments: Scalp laceration, dry blood, staples in place, tender to palpation Cardiovascular:     Rate and Rhythm: Normal rate and regular rhythm.  Pulmonary:     Effort: Pulmonary effort is normal.     Breath sounds: Normal breath sounds.  Abdominal:     General: Abdomen is flat. Bowel sounds are normal.      Palpations: Abdomen is soft.  Musculoskeletal:        General: No swelling. Normal range of motion.  Skin:    Capillary Refill: Capillary refill takes less than 2 seconds.  Neurological:     General: No focal deficit present.     Mental Status: She is alert and oriented to person, place, and time.  Psychiatric:        Mood and Affect: Mood normal.        Behavior: Behavior normal.      Assessment/Plan:  Active Problems:   Subdural hematoma (HCC)  Subdural hematoma: This is a 86 year old female with a history of HTN presenting after a mechanical fall that occurred after walking in her sleep, noted to have a subdural hematoma on imaging, no midline shift, mass effect or neurological deficits noted. CBC this AM noted small decrease in Hgb 10.8>9.1. This is expected due to patient's subdural hematoma.  NSG evaluated, no NSG intervention required at this time, recommended repeat CT scan in AM. Repeat CT scan this morning showed stable hematoma, no mass effect or midline shift noted. Patient reported some nausea and headache today, no neurological deficits noted. She may have had a  concussion that is causing her nausea and headache. We discussed the results of the imaging and the scalp laceration. Discussed how to manage the laceration with patient and daughter. We also explained that the body will reabsorb the blood in the brain.  We discussed that she can go home today, or if she wants then we can keep her for one more day. She would feel comfortable going home today, we feel that patient is stable for discharge. Family will implement safety around the house to address patient's possible sleepwalking that led to her falling down the stairs. -Zofran once for nausea -PT/OT - Tylenol PRN for pain   Scalp laceration: Staples in place, has dried blood and tenderness to palpation. No drainage or swelling noted.  -Follow up in 1 week in IMTS clinic for stable removal -Advised to use over the  counter tylenol as needed for pain control  HTN: On Losartan 50 mg daily at home. Resumed this here. BP well controlled. Will have her follow up outpatient.   Prior to Admission Living Arrangement: Home Anticipated Discharge Location: Home Barriers to Discharge: None Dispo: Anticipated discharge in approximately today.   Claudean Severance, MD 11/23/2019, 1:50 PM Pager: [phone] After 5pm on weekdays and 1pm on weekends: On Call pager [phone]

## 2019-11-23 NOTE — TOC Initial Note (Signed)
Transition of Care [facility]) - Initial/Assessment Note    Patient Details  Name: Cynthia Foley MRN: [id number] Date of Birth: [birth year]  Transition [facility] Care [facility]) CM/SW Contact:    Pollie Friar, RN Phone Number: 11/23/2019, 3:20 PM  Clinical Narrative:                 CM spoke with PT and they stated they would recommend CIR. Pt is from [place] here visiting family. CM met with the patient and her daughter at the bedside using interpreter services.  Pt lives in [place] with daughter at the bedside and daughters spouse who is a MD. Pt has PCP in [place].  Daughter states they plan to return to [place] on the 31st of September.  Pt slept through meeting.  TOC following.  Expected Discharge Plan: IP Rehab Facility Barriers to Discharge: Continued Medical Work up   Patient Goals and CMS Choice   CMS Medicare.gov Compare Post Acute Care list provided to:: Patient Represenative (must comment) Choice offered to / list presented to : Adult Children  Expected Discharge Plan and Services Expected Discharge Plan: [place]   Discharge Planning Services: CM Consult   Living arrangements for the past 2 months: Single Family Home                                      Prior Living Arrangements/Services Living arrangements for the past 2 months: Single Family Home Lives with:: Adult Children Patient language and need for interpreter reviewed:: Yes (spanish speaking)        Need for Family Participation in Patient Care: Yes (Comment) Care giver support system in place?: Yes (comment) (lives with daughter and son in [place])   Criminal Activity/Legal Involvement Pertinent to Current Situation/Hospitalization: No - Comment as needed  Activities of Daily Living Home Assistive Devices/Equipment: Eyeglasses, Blood pressure cuff, Cane (specify quad or straight) ADL Screening (condition at time of admission) Patient's cognitive ability adequate to safely complete daily activities?: No Is the  patient deaf or have difficulty hearing?: No Does the patient have difficulty seeing, even when wearing glasses/contacts?: No Does the patient have difficulty concentrating, remembering, or making decisions?: No Patient able to express need for assistance with ADLs?: No Does the patient have difficulty dressing or bathing?: No Independently performs ADLs?: Yes (appropriate for developmental age) Does the patient have difficulty walking or climbing stairs?: Yes Weakness of Legs: Right Weakness of Arms/Hands: None  Permission Sought/Granted                  Emotional Assessment Appearance:: Appears stated age         Psych Involvement: No (comment)  Admission diagnosis:  Subdural hematoma ([place]) [S06.5X9A] Traumatic subdural hemorrhage with loss of consciousness of 30 minutes or less, initial encounter ([place]) [S06.5X1A] Fall down stairs, initial encounter [W10.8XXA] Laceration of scalp, initial encounter [S01.01XA] Patient Active Problem List   Diagnosis Date Noted  . Subdural hematoma ([place]) 11/22/2019   PCP:  Patient, No Pcp Per Pharmacy:   [id number] - [place], [place] [place] [address] Phone: [phone] Fax: [phone]     Social Determinants of Health (SDOH) Interventions    Readmission Risk Interventions No flowsheet data found.

## 2019-11-23 NOTE — Progress Notes (Signed)
Patient a Spanish speaking came  To visit children from [place]. S/p fall and sustained SDH.Marland Kitchen Alert and oriented x4 follows command  Pt ,oriented  to unit and use of call  Light  Interpretation offered by son   and later an international launguage interpretor  with I.D # Gaby # [phone] and Christ , ID No [id number] to explained and educate pt and family on care of plan  wound care and pain management. to Patient and family. Pt verbalized good understanding.. Vs stable. cardiac monitor in use verification completed ,Pt care of pain to a bruise to left shin/headache. Medicated and ice pack to affected leg with some relief. RN will continue to monitor pt.

## 2019-11-23 NOTE — Progress Notes (Signed)
Physical Therapy Evaluation Patient Details Name: Cynthia Foley MRN: [id number] DOB: November 18, 1930 Today's Date: 11/23/2019   History of Present Illness  This is a 86 year old female with a history of HTN presenting after a mechanical fall down stairs that occurred after walking to the bathroom at night. CT revealed 7 mm subdural hematoma, no midline shift, mass effect or neurological deficits noted.    Clinical Impression  Cynthia Foley is 86 y.o. female admitted with above HPI and diagnosis. Patient is currently limited by functional impairments below (see PT problem list). Patient lives in [place] and is visiting her family; she is independent with [facility] for mobility at baseline. Patient currently requires min assist for short bout of gait with RW and is very unsteady without walker for support. Patient will benefit from continued skilled PT interventions to address impairments and progress independence with mobility, recommending 24/7 supervision from family and HHPT follow up if pt is elligable. Acute PT will follow and progress as able.     Follow Up Recommendations Supervision/Assistance - 24 hour;Home health PT    Equipment Recommendations  Rolling walker with 5" wheels (short)    Recommendations for Other Services       Precautions / Restrictions Precautions Precautions: Fall Restrictions Weight Bearing Restrictions: No      Mobility  Bed Mobility Overal bed mobility: Needs Assistance Bed Mobility: Supine to Sit;Sit to Supine     Supine to sit: HOB elevated;Min guard Sit to supine: HOB elevated;Min guard   General bed mobility comments: min guard for safety to sit up EOB, pt required use of bed rail and HOB elevated.   Transfers Overall transfer level: Needs assistance Equipment used: Rolling walker (2 wheeled);1 person hand held assist Transfers: Sit to/from Stand Sit to Stand: Min assist         General transfer comment: Min assist with 1HHA to steady with rise. pt  required min guard/assist with RW.  Ambulation/Gait Ambulation/Gait assistance: Min assist Gait Distance (Feet): 20 Feet (1x6, 1x14) Assistive device: Standard walker;1 person hand held assist Gait Pattern/deviations: Step-through pattern;Decreased step length - right;Decreased step length - left;Decreased stride length;Trunk flexed Gait velocity: decr   General Gait Details: pt slow with gait and unsteady reaching out in low gaurd position with 1HHA. Pt reaching for sink counter to steady self after taking small steps fwd. Pt stepped bkwd to bed and gait attempted with  RW. pt with improved steadines, required cues for safe hand placement and proximity to RW.   Stairs            Wheelchair Mobility    Modified Rankin (Stroke Patients Only)       Balance Overall balance assessment: Needs assistance;History of Falls Sitting-balance support: Feet supported Sitting balance-Leahy Scale: Good     Standing balance support: During functional activity;Bilateral upper extremity supported Standing balance-Leahy Scale: Fair            Pertinent Vitals/Pain Pain Assessment: Faces Faces Pain Scale: Hurts whole lot Pain Location: headache and nausea Pain Descriptors / Indicators: Aching;Headache Pain Intervention(s): Limited activity within patient's tolerance;Patient requesting pain meds-RN notified    Home Living Family/patient expects to be discharged to:: Private residence Living Arrangements: Children Available Help at Discharge: Family Type of Home: House Home Access: Stairs to enter Entrance Stairs-Rails: None Entrance Stairs-Number of Steps: 2 Home Layout: Two level;Bed/bath upstairs Home Equipment: Gilmer Mor - single point Additional Comments: Pt is visiting her family and is from [place]. She is staying at her sons  home. She has been sleeping in upstairs bedroom.     Prior Function Level of Independence: Independent with assistive device(s)         Comments: pt is  independent with SPC for mobility at baseline.      Hand Dominance   Dominant Hand: Right    Extremity/Trunk Assessment   Upper Extremity Assessment Upper Extremity Assessment: Defer to OT Evaluation    Lower Extremity Assessment Lower Extremity Assessment: Generalized weakness    Cervical / Trunk Assessment Cervical / Trunk Assessment: Kyphotic  Communication   Communication: Prefers language other than English  Cognition Arousal/Alertness: Awake/alert Behavior During Therapy: WFL for tasks assessed/performed Overall Cognitive Status: Difficult to assess                 General Comments General comments (skin integrity, edema, etc.): pt limitd by nausea, dizziness, and headache with mobility. BP 156/51 sitting EOB and 168/52 after gait.     Exercises     Assessment/Plan    PT Assessment Patient needs continued PT services  PT Problem List Decreased strength;Decreased activity tolerance;Decreased balance;Decreased mobility;Decreased knowledge of use of DME;Decreased safety awareness       PT Treatment Interventions DME instruction;Gait training;Stair training;Functional mobility training;Therapeutic activities;Therapeutic exercise;Balance training;Patient/family education    PT Goals (Current goals can be found in the Care Plan section)  Acute Rehab PT Goals Patient Stated Goal: to get something for pain and eventually return to [place] PT Goal Formulation: With patient/family Time For Goal Achievement: 12/07/19 Potential to Achieve Goals: Fair    Frequency Min 3X/week    AM-PAC PT "6 Clicks" Mobility  Outcome Measure Help needed turning from your back to your side while in a flat bed without using bedrails?: None Help needed moving from lying on your back to sitting on the side of a flat bed without using bedrails?: A Little Help needed moving to and from a bed to a chair (including a wheelchair)?: A Little Help needed standing up from a chair using your  arms (e.g., wheelchair or bedside chair)?: A Little Help needed to walk in hospital room?: A Little Help needed climbing 3-5 steps with a railing? : A Lot 6 Click Score: 18    End of Session Equipment Utilized During Treatment: Gait belt Activity Tolerance: Other (comment) (limited by nausa, headache) Patient left: in bed;with call bell/phone within reach;with bed alarm set;with family/visitor present Nurse Communication: Mobility status PT Visit Diagnosis: Muscle weakness (generalized) (M62.81);Difficulty in walking, not elsewhere classified (R26.2)    Time: 3154-0086 PT Time Calculation (min) (ACUTE ONLY): 33 min   Charges:   PT Evaluation $PT Eval Moderate Complexity: 1 Mod PT Treatments $Gait Training: 8-22 mins      Wynn Maudlin, DPT Acute Rehabilitation Services  Office [phone] Pager [phone]  11/23/2019 5:10 PM

## 2019-11-23 NOTE — Progress Notes (Signed)
Patient ID: Cynthia Foley, female   DOB: [birth year], 86 y.o.   MRN: [id number] BP (!) 165/61 (BP Location: Right Arm)   Pulse 71   Temp 99.4 F (37.4 C) (Oral)   Resp 18   Ht 5\' 2"  (1.575 m)   Wt 75.3 kg   LMP  (LMP Unknown)   SpO2 97%   BMI 30.36 kg/m  CT reviewed. No change on ct. May be discharged from neurological standpoint.

## 2019-11-24 LAB — CBC
HCT: 29.2 % — ABNORMAL LOW (ref 36.0–46.0)
Hemoglobin: 9.2 g/dL — ABNORMAL LOW (ref 12.0–15.0)
MCH: 29.6 pg (ref 26.0–34.0)
MCHC: 31.5 g/dL (ref 30.0–36.0)
MCV: 93.9 fL (ref 80.0–100.0)
Platelets: 263 10*3/uL (ref 150–400)
RBC: 3.11 MIL/uL — ABNORMAL LOW (ref 3.87–5.11)
RDW: 13.6 % (ref 11.5–15.5)
WBC: 6.3 10*3/uL (ref 4.0–10.5)
nRBC: 0 % (ref 0.0–0.2)

## 2019-11-24 MED ORDER — OXYCODONE HCL 5 MG PO TABS
5.0000 mg | ORAL_TABLET | ORAL | 0 refills | Status: AC | PRN
Start: 2019-11-24 — End: 2019-11-27

## 2019-11-24 NOTE — Progress Notes (Signed)
Discharged to home after IV access removed and extensive education provided by Dr Rich Fuchs, who provided the entire discharge education in Spanish.  Patient was slightly confused and was upset and telling this nurse and doctors that she had not seen any doctors since she was here.  She was reminded that they did come daily while she was here and they did talk to her via the interpreter tablet.  Her grandson also reminded her that they did see her, as they came while he was there.  She did also complain that she did not eat yesterday or lunch today and it was 2:00 PM.  She was reminded that this nurse did order her some soup yesterday, as she refused to eat anything because of nausea and fear of getting nauseated if she ate.  She did eat the soup and had some at lunch and at dinner time but said she did not.  Her grandson did remind her that his aunt (her daughter) did say she had some yesterday and today the tech offered it and she declined because her son said he was going to bring her some soup.  She also said she did not get cleaned up today but she had declined so tech did tell her she would assist her to get cleaned up after she ate her lunch.  She had forgotten.  She also said no one did anything about her head but this nurse and her grandson did remind her that this nurse cleaned and changed her dressing today and the incision started bleeding and I had to hold pressure on the site for a while and she did eventually remember that

## 2019-11-24 NOTE — TOC CAGE-AID Note (Signed)
Transition of Care [facility]) - CAGE-AID Screening   Patient Details  Name: Cynthia Foley MRN: [id number] Date of Birth: 1930/11/16  Transition [facility]) CM/SW Contact:    Jimmy Picket, [place] Phone Number: 11/24/2019, 4:31 PM   Clinical Narrative:  PT unable to participate.   CAGE-AID Screening: Substance Abuse Screening unable to be completed due to: : Patient unable to participate            Jimmy Picket, [place] Clinical Social Worker [phone]       Jimmy Picket, Theresia Majors, [place] Clinical Social Worker [phone]

## 2019-11-24 NOTE — Progress Notes (Signed)
Subjective: Patient is doing well this morning. Nurses were in her room signing off and reported that patient had some nausea overnight, but asked for lemon instead of zofran. The bought her lemon packets. She reports that she would prefer real lemons, but they helped. She reports that she had nausea with each meal and drink yesterday, but otherwise has no nausea. She does have a feeling of increased pressure at the site of her wound, but does not report double vision this AM. She also reports that she would like to have her bandage cleaned.   Objective:  Vital signs in last 24 hours: Vitals:   11/23/19 1957 11/23/19 2338 11/24/19 0352 11/24/19 0740  BP: (!) 160/67 (!) 136/58 (!) 133/53 (!) 142/70  Pulse: 84 86 82 87  Resp: 16 17 17 18   Temp: 99.8 F (37.7 C) 99.8 F (37.7 C) 98.9 F (37.2 C) 98.7 F (37.1 C)  TempSrc: Oral Oral Oral Oral  SpO2: 98% 95% 95% 97%  Weight:   74.6 kg   Height:       Physical Exam Constitutional:      General: She is not in acute distress.    Appearance: Normal appearance. She is not ill-appearing, toxic-appearing or diaphoretic.  HENT:     Head:     Comments: Staples and dry blood to wound to the posterior parietal region on the left side of the head. Covered in bandage Eyes:     Extraocular Movements: Extraocular movements intact.     Pupils: Pupils are equal, round, and reactive to light.     Comments: [place] of the R. Eye, medially  Cardiovascular:     Rate and Rhythm: Normal rate and regular rhythm.     Heart sounds: Murmur heard.      Comments: Slight murmur loudest at Left 2nd intercostal space Pulmonary:     Effort: Pulmonary effort is normal.     Breath sounds: Normal breath sounds.  Abdominal:     General: Abdomen is flat. Bowel sounds are normal.     Palpations: Abdomen is soft.  Skin:    General: Skin is warm and dry.     Comments: L leg wound covered in bandage, remains tender to palpation  Neurological:     General: No  focal deficit present.     Mental Status: She is alert and oriented to person, place, and time.     Cranial Nerves: No cranial nerve deficit.     Sensory: No sensory deficit.     Motor: No weakness.     Coordination: Coordination normal.     Assessment/Plan:  Active Problems:   Subdural hematoma (HCC)  Subdural hematoma: Patient was expected to be discharged today however, after being seen by PT it was felt that it was unsafe for patient to go home before being instructed on how to use her walker. OT saw patient and reported patient should be safe so long as she has 24/7 assistance. After discussing with patient family they agreed to stay one more night to reassess patient nausea and PT. Patient required 10mg  Oxy IR yesterday and 5mg  this AM. - Zofran PRN or lemon for nausea -PT/OT - Tylenol PRN for pain - Oxy IR 5mg  q 4h PRN  Scalp laceration: Staples in place, has dried blood and tenderness to palpation. No drainage or swelling noted.  -Follow up in 1 week in IMTS clinic for stable removal   HTN: On Losartan 50 mg daily at home. Resumed this here.  BP well controlled. Will have her follow up outpatient.   Prior to Admission Living Arrangement: Home Anticipated Discharge Location: Home Barriers to Discharge: None Dispo: Anticipated discharge today.  Bobbye Morton, MD 11/24/2019, 7:52 AM Pager: [phone] After 5pm on weekdays and 1pm on weekends: On Call pager [phone]

## 2019-11-24 NOTE — Discharge Instructions (Signed)
Cynthia Foley,  Ha sido un placer trabajar con usted y nos alegra que se sienta mejor. Fue hospitalizada despus de caerse en las escaleras. Hicimos algunas imgenes y descubrimos que tiene sangramiento en el crneo, y la segunda imagen mostr que el sangramiento estaba estable. Tena una laceracin en el cuero cabelludo y necesitaba que le pusieran grapas. Hicimos algunas imgenes de sus piernas y no parece que haya tenido fracturas en las piernas. Esta bien tomar limon para las nauseas.   Tiene una cita en la clnica de medicina interna para remover las grapas. Su cita ser el 23 de septiembre a las 8:45 AM en [place] clnica de medicina interna en [facility] con el Dr. Dolan Amen.  Contine tomando su losartn diariamente para su presin arterial.  Si sus sntomas empeoran o si nota sntomas nuevos, busque ayuda mdica, ya sea con su proveedor de atencin primaria o con el departamento de emergencias.  Si tiene [employer] hospitalizacin, llame al [phone].    Cuidado de Patent examiner, en adultos Laceration Care, Adult Un desgarro es un corte que puede atravesar todas las capas de la piel. El corte tambin puede llegar al tejido que est debajo de la piel. Algunos cortes cicatrizan por s solos. Otros se deben cerrar con puntos (suturas), grapas, tiras [phone] para la piel o goma para cerrar la piel. Cuidar de la lesin reduce el riesgo de infeccin, ayuda a que la lesin se cure mejor, y puede prevenir la formacin de cicatrices. Materiales necesarios:  Jabn.  [place]gu[place].  Desinfectante de manos.  Venda (vendaje).  Ungento antibitico.  Toalla limpia. Cmo cuidar del corte Lvese las manos con agua y jabn antes de tocarse la herida y Psychologist, educational venda. Use desinfectante para manos si no dispone de [place] y [place]. Si el mdico utiliz puntos o grapas:  Mantenga la herida limpia y seca.  Si le colocaron una venda, [place] por lo menos una vez por  da segn se lo haya indicado el mdico. Tambin debe cambiarla si se moja o se ensucia.  Mantenga la herida completamente seca durante las primeras 24horas o como se lo haya indicado el mdico. Despus, podr ducharse o tomar un bao de inmersin. No sumerja la herida en agua hasta que le hayan quitado los puntos o las grapas.  Limpie la herida una vez por da o como se lo haya indicado el mdico: ? Lave la herida con agua y [place]. ? Enjuguela con agua para quitar todo el jabn. ? Squela dando palmaditas con una toalla limpia. No frote la herida.  Despus de limpiar la herida, aplique una capa delgada de ungento con antibitico como se lo haya indicado el mdico. Este ungento: ? Ayuda a prevenir una infeccin. ? Evita que la venda se adhiera a la herida.  Haga que le retiren los puntos o las grapas como se lo haya indicado el mdico. Si el mdico [place] tiras [place] para la piel:  Mantenga la herida limpia y seca.  Si le colocaron una venda, debe cambiarla por lo menos una vez por da como se lo haya indicado el mdico. Tambin debe cambiarla si se moja o se ensucia.  No deje que las tiras [place] para la piel se mojen. Puede baarse o ducharse, pero mantenga la herida seca.  Si se moja, squela dando palmaditas con una toalla limpia. No frote la herida.  Las tiras [place] para la piel se caen solas. Puede recortar las tiras a [employer]  la herida cicatriza. No quite las tiras que an estn pegadas a la herida. Se caern despus de un tiempo. Si el mdico utiliz goma para cerrar la piel:  Trate de Pharmacologist la herida seca; sin embargo, puede mojarla ligeramente cuando se bae o se duche. No sumerja la herida en el agua, por ejemplo, al nadar.  Despus de ducharse o baarse, seque la herida con cuidado dando palmaditas con una toalla limpia. No frote la herida.  No practique actividades que lo hagan transpirar mucho hasta que la goma para cerrar la piel se haya salido  sola.  No aplique lquidos, cremas ni ungentos medicinales en la herida mientras est la goma para cerrar la piel.  Si le colocaron una venda, debe cambiarla por lo menos una vez por da o como se lo haya indicado el mdico. Tambin debe cambiarla si se ensucia o se moja.  Si le colocan una venda sobre la herida, no deje que la cinta toque la goma para cerrar la piel.  No toque la goma. La goma para cerrar la piel suele permanecer en la piel de 5a 10das. Luego, se sale solo de la piel. Indicaciones generales   [employer] de venta libre y los recetados solamente como se lo haya indicado el mdico.  Si le indicaron un ungento o un medicamento con antibitico, aplquelo o tmelo como se lo haya dicho el mdico. No deje de usarlo aunque la afeccin mejore.  No se rasque ni se toque la herida.  Controle la herida [employer] para detectar signos de infeccin. Est atento a lo siguiente: ? Dolor, hinchazn o enrojecimiento. ? Lquido, sangre o pus.  Cuando est sentado o acostado, levante (eleve) la zona de la lesin por encima del nivel del corazn.  Si se lo indican, aplique hielo sobre la zona afectada: ? Ponga el hielo en una bolsa plstica. ? Coloque una [employer] piel y la bolsa de hielo. ? Coloque el hielo durante , 2 a 3veces por da.  Evite la formacin de cicatrices aplicndose pantalla solar con factor de proteccin solar (FPS) de 30 sobre la herida, como mnimo, siempre que est al aire libre despus de que la herida haya cicatrizado.  Concurra a todas las visitas de [address] se lo haya indicado el mdico. Esto es importante. Solicite ayuda si:  Recibi una vacuna antitetnica y tiene cualquiera de estos problemas en el sitio de la inyeccin: ? Hinchazn. ? Dolor intenso. ? Enrojecimiento. ? Sangrado.  Tiene fiebre.  Una herida que estaba cerrada y se abre.  Percibe que sale mal olor de la herida o de la venda.  Nota un  cuerpo extrao en la herida, como un trozo de [place] o vidrio.  Los medicamentos no Editor, commissioning.  Tiene ms enrojecimiento, hinchazn o dolor en el lugar de la herida.  Presenta lquido, sangre o pus que provienen de la herida.  Observa que la piel cerca de la herida cambia de color.  Debe cambiar la venda con frecuencia debido a que hay secrecin de lquido, sangre o pus de la herida.  Tiene una erupcin cutnea nueva.  Comienza a tener adormecimiento alrededor [employer] herida. Solicite ayuda de inmediato si:  Hay mucha hinchazn alrededor de la herida.  El dolor empeora repentinamente y es muy intenso.  Percibe bultos dolorosos cerca de la herida o en cualquier parte del cuerpo.  Tiene una lnea roja que sale de la herida.  La herida est en la  mano o en el pie y: ? No puede mover los dedos. ? Los dedos se ven plidos o azulados. Resumen  Un desgarro es un corte que puede atravesar todas las capas de la piel. El corte tambin puede llegar al tejido que est justo debajo de la piel.  Algunos cortes cicatrizan por s solos. Otros se deben cerrar con puntos, grapas, tiras [phone] para la piel o goma para cerrar la piel.  Siga las indicaciones del mdico respecto del cuidado de su corte. El cuidado adecuado de un corte reduce el riesgo de infeccin, ayuda a que el corte cierre mejor, y previene la formacin de cicatrices. Esta informacin no tiene Theme park manager el consejo del mdico. Asegrese de hacerle al mdico cualquier pregunta que tenga. Document Revised: 05/02/2017 Document Reviewed: 05/02/2017 Elsevier Patient Education  2020 Elsevier Inc.    Lesin en la cabeza, en adultos Head Injury, Adult Hay muchos tipos de lesiones en la cabeza. Algunas pueden ser leves, como un chichn. Otras pueden ser ms graves. Ejemplos de lesiones graves:  Un golpe fuerte en la cabeza que sacude el cerebro hacia delante y atrs (conmocin cerebral).  Un moretn (contusin)  en el cerebro. Esto significa que hay hemorragia en el cerebro que puede causar un edema.  Fisura en el crneo (fractura de crneo).  Hemorragia en el cerebro que se acumula, se espesa (se produce un cogulo) y forma un bulto (hematoma). La mayora de los problemas provocados por una lesin en la cabeza ocurren durante las primeras 24horas. Sin embargo, es posible que siga teniendo efectos secundarios entre 7y10das despus de la lesin. Es importante controlar su afeccin para ver si hay cambios. Es posible que deban observarlo en el departamento de emergencias o en el servicio de atencin urgente, o puede ser necesario que se quede en el hospital. [place] son las causas? Hay muchas causas posibles de una lesin en la cabeza. Una lesin en la cabeza puede tener estas causas:  Un accidente automovilstico.  Accidentes en bicicleta o motocicleta.  Lesiones deportivas.  Cadas. Cules son los signos o los sntomas? Los sntomas de lesin en la cabeza incluyen un moretn, un chichn o un sangrado en el lugar de la lesin. Otros sntomas fsicos pueden ser:  Dolor de [place].  Malestar estomacal (nuseas) o vmitos.  Mareos.  Cansancio.  Sentir incomodidad cerca de luces brillantes o ruidos fuertes.  Temblores que no puede controlar (convulsiones).  Dificultad para despertarse.  Desmayos. Los sntomas mentales o emocionales pueden incluir los siguientes:  Sentirse abrumado o malhumorado.  Confusin y [address] de [place].  Tener problemas para prestar atencin o concentrarse.  Cambios en los hbitos de alimentacin o en el sueo.  Sentirse preocupado o nervioso (ansioso).  Sentirse triste (deprimido). Cmo se trata? El tratamiento de esta afeccin depende de la gravedad de la lesin y del tipo de lesin que sufri. El objetivo principal es prevenir complicaciones y darle tiempo al cerebro para que se recupere. Lesin de cabeza leve Si usted sufre lesin leve en la  cabeza, es posible que lo enven a casa, y el tratamiento puede incluir lo siguiente:  Estar en observacin. Un adulto responsable debe quedarse con usted durante 24horas despus de producida la lesin y controlarlo con frecuencia.  Descanso fsico.  Descanso cerebral.  Analgsicos. Lesin de [place] grave Si tiene una lesin grave en la cabeza, el tratamiento puede incluir lo siguiente:  Estar en observacin cercana. Esto incluye hospitalizacin con exmenes fsicos frecuentes.  Medicamentos para: ? Contractor  a Engineer, materials. ? Evitar los temblores que no se Sports coach. ? Ayudar con la hinchazn del cerebro.  El uso de una mquina que lo ayude a Industrial/product designer (respirador).  Tratamientos para controlar la hinchazn dentro del cerebro.  Ciruga de cerebro. Esta puede ser [employer] siguientes casos: ? Extraer un cogulo de [place]. ? Interrumpir el sangrado. ? Retirar una parte del crneo. Esto permite que el cerebro tenga lugar para hincharse. Siga estas instrucciones en su casa: Actividad  Haga reposo.  Evite las actividades difciles o cansadoras.  Asegrese de dormir lo suficiente.  Limite las actividades que requieran pensar mucho o prestar mucha atencin, como las siguientes: ? Public affairs consultant televisin. ? Jugar juegos de memoria y armar rompecabezas. ? Tareas para el hogar o trabajos relacionados con el empleo. ? Trabajar en la computadora, participar en redes sociales y enviar mensajes de texto.  Evite las Northeast Utilities que pudieran provocar otra lesin en la cabeza hasta que el mdico lo autorice. Entre ellas, practicar deportes. Puede ser peligroso tener otra lesin en la cabeza antes de que se haya recuperado de la primera.  Pregunte al mdico cundo puede regresar a las actividades normales sin riesgo, como al [place] o a la escuela. Pida al mdico un plan detallado para volver a Education officer, environmental las actividades habituales de [place] progresiva.  Consulte a su mdico cundo  Merchandiser, retail, Lobbyist o usar maquinaria pesada. No realice estas actividades si se siente mareado. Estilo de vida   No beba alcohol hasta que el mdico lo autorice.  No consuma drogas.  Si le resulta ms difcil que lo habitual recordar las cosas, escrbalas.  Trate de hacer una cosa por vez si se distrae con facilidad.  Consulte con familiares y amigos si debe tomar decisiones importantes.  [place] a sus amigos, familiares, colegas de confianza y su gerente en el trabajo sobre su lesin, los sntomas y sus lmites (restricciones). Pdales que observen si aparecen nuevos problemas o empeoran los existentes. Instrucciones generales  [employer] de venta libre y los recetados solamente como se lo haya indicado el mdico.  Pdale a alguien que lo acompae durante 24horas despus de la lesin en la cabeza. Esta persona debe observar si hay cambios en los sntomas y estar preparada para [place].  Concurra a todas las visitas de [address] se lo haya indicado el mdico. Esto es importante. Cmo se evita?  Trabaje en su equilibrio y su fuerza. Esto puede ayudarlo a evitar cadas.  Use el cinturn de seguridad cuando se encuentre en un vehculo en movimiento.  Use un casco cuando realice las siguientes actividades: ? Ande en bicicleta. ? Esquiar. ? Practicar algn otro deporte o actividad que representen un riesgo de lesin.  Si bebe alcohol: ? Limite la cantidad que bebe:  De 0 a 1 medida por da para las mujeres.  De 0 a 2 medidas por da para los hombres. ? Est atento a la cantidad de alcohol que hay en las bebidas que toma. En los [place], una medida equivale a una botella de cerveza de 12oz ( ), un vaso de vino de 5oz ( ) o un vaso de una bebida alcohlica de alta graduacin de 1oz (31ml).  Haga de su hogar un lugar ms seguro: ? Deshacerse de los obstculos en pisos y escaleras. Esto incluye las cosas que pueden  hacer que se tropiece. ? Coloque barras para sostn en los baos y [employer] en las escaleras. ? Ponga alfombras antideslizantes en  pisos y baeras. ? Mejore la iluminacin de las zonas oscuras. Solicite ayuda inmediatamente si:  Tiene lo siguiente: ? Dolor de cabeza muy fuerte que no se calma con medicamentos. ? Dificultad para caminar o debilidad en los brazos o las piernas. ? Secrecin transparente o con sangre que proviene de la nariz o de los odos. ? Cambios en la forma de ver (visin). ? Temblores que no Magazine features editorpuede controlar.  Pierde el equilibrio.  Vomita.  La parte central negra de los ojos (pupila) cambia de [place].  Arrastra las palabras.  Su mareo empeora.  Se desmaya.  Est ms somnoliento de lo normal o tiene dificultad para mantenerse despierto.  Sus sntomas empeoran. Estos sntomas pueden Customer service managerindicar una emergencia. No espere a ver si los sntomas desaparecen. Solicite atencin mdica de inmediato. Comunquese con el servicio de emergencias de su localidad (911 en los [place]). No conduzca por sus propios medios OfficeMax Incorporatedhasta el hospital. Resumen  Hay muchos tipos de lesiones en la cabeza. Algunas pueden ser leves, como un chichn. Otras pueden ser ms graves  El tratamiento de esta afeccin depende de la gravedad de la lesin y del tipo de lesin que sufri.  Pregunte al mdico cundo puede regresar a las actividades normales sin riesgo, como al [place] o a la escuela.  Para evitar una lesin en la cabeza, use cinturn de seguridad cuando se desplace en automvil, use casco cuando monte en bicicleta, limite el consumo de alcohol y haga que su hogar sea ms seguro. Esta informacin no tiene Theme park managercomo fin reemplazar el consejo del mdico. Asegrese de hacerle al mdico cualquier pregunta que tenga. Document Revised: 04/20/2018 Document Reviewed: 04/20/2018 Elsevier Patient Education  2020 [employer]vin[employer]vier Inc.   Prevencin de cadas en Advice workerel hogar, en adultos Fall Prevention in the  Home, Adult Las cadas pueden causar lesiones. Las personas de todas las edades pueden sufrir cadas. Hay muchas cosas que puede hacer para que su casa sea un lugar seguro y para ayudar a prevenir las cadas. Pida ayuda cuando haga estos cambios, si la necesita. Qu medidas puedo tomar para prevenir cadas? Instrucciones generales  Use buena iluminacin en todas las habitaciones. Reemplace las bombillas que se hayan quemado.  Encienda las luces cuando ingrese a una zona [place]oscura. Use luces nocturnas.  Mantenga los objetos que [place] con frecuencia en lugares de fcil acceso. Baje los estantes de toda la casa de ser necesario.  Organice los muebles de modo de que haya espacio para caminar a su alrededor. Evite cambiar los BlueLinxmuebles de Environmental consultantlugar.  No tenga alfombras ni otros objetos en el piso que puedan hacerlo tropezar.  Evite caminar sobre pisos mojados.  Si los pisos estn desparejos, reprelos.  Agregue pintura o cinta de color o contraste para Cabin crewmarcar con claridad y poder ver: ? Las barras para sostn o los pasamanos. ? El Engineer, maintenanceprimer y el ltimo escaln de las escaleras. ? Dnde est el borde de cada escaln.  Si [place]a una escalera de mano: ? Asegrese de que est abierta por completo. No suba a una escalera de mano cerrada. ? Verifique que ambos lados de la escalera de mano estn asegurados. ? Pdale a alguien que Animal nutritionistsostenga la escalera de mano mientras usted la [place].  Si hay mascotas cerca, fjese dnde estn. Qu puedo hacer en el bao?      Mantenga el piso seco. Seque cualquier derrame de agua en el piso ni bien se produzca.  Elimine con frecuencia la acumulacin de jabn en la baera o la ducha.  Utilice alfombras o pegatinas antideslizantes en el piso de la baera o ducha.  Asegure las alfombras del bao con una cinta antideslizante doble faz para alfombras.  Si necesita sentarse cuando se ducha, use un banco plstico antideslizante.  Instale barras para sostn al lado del  inodoro, en la baera y en la ducha. No use los toalleros como barras para sostn. Qu puedo hacer en el dormitorio?  Asegrese de tener una [place] junto a la cama que sea fcil de [place].  No use sbanas ni mantas que sean demasiado grandes para la cama. No deben colgar sobre el piso.  Tenga una silla firme con apoyabrazos. Puede usarla para apoyarse cuando se viste. Qu puedo hacer en la cocina?  Limpie de inmediato cualquier derrame.  Si necesita alcanzar algo que est [address], use una escalera firme con una barra de apoyo.  Mantenga los cables elctricos fuera del camino.  No use un pulidor o cera para pisos que dejen los pisos resbaladizos. Si tiene que usar cera, utilice cera antideslizante. Qu puedo hacer con las escaleras?  No deje ningn objeto en las escaleras.  Asegrese de tener un interruptor de [place] en la parte superior e inferior de las escaleras. Si no lo tiene, pdale a alguien que se lo instale.  Asegrese de que haya pasamanos en ambos lados de las escaleras y selos. Repare los pasamanos que estn flojos o rotos. Asegrese de que los pasamanos tengan la misma longitud que la escalera.  Instale peldaos antideslizantes en todas las escaleras de su casa.  No coloque alfombras en la parte superior o inferior de las escaleras. Si tiene alfombras, asegrelas al piso con cinta adhesiva para alfombras.  Para la escalera, elija una alfombra que no oculte el borde de los [employer].  Verifique que las alfombras estn bien adheridas a las escaleras. Arregle las alfombras flojas o gastadas. Qu puedo hacer en el exterior de mi casa?  Use una iluminacin brillante en el exterior.  Arregle peridicamente los bordes de las aceras y las [place], as [employer].  Retire las cosas que pueden hacerlo tropezar cuando cruce una puerta, por ejemplo, un escaln o un umbral elevados.  Pode los arbustos o los rboles que se encuentran en el camino a su casa.  Verifique  con frecuencia que los pasamanos no estn flojos ni rotos. Asegrese de que haya pasamanos a ambos lados de los escalones.  Instale barandillas de proteccin en los bordes de las terrazas y Soil scientist.  Retire los [employer] que estn en el camino y que puedan hacer que una persona se tropiece, como herramientas o piedras.  Limpie regularmente las hojas, la nieve o el hielo.  Utilice arena o sal en los pasillos durante el invierno.  Limpie de inmediato los derrames en el garaje. Esto incluye los derrames de [place]. Qu otras medidas puedo tomar?  Use calzado con estas caractersticas: ? Que tenga taco bajo. No use zapatos de taco alto. ? Que tenga suela de goma. ? Que sean cmodos y [address] ? Que sean cerrados. No use sandalias con punta abierta.  Use herramientas que lo ayuden a desplazarse (dispositivos de movilidad), si son necesarias. Estos incluyen: ? Bastones. ? Andadores. ? Patinetes con soporte para el pie. ? Muletas.  Revise los medicamentos con el mdico. Algunos medicamentos pueden hacer que se sienta mareado. Esto puede aumentar el riesgo de caerse. Pregntele al mdico qu otras cosas puede hacer para ayudar a prevenir las cadas. Dnde encontrar  ms informacin  Centros para Air traffic controller y Psychiatrist de Child psychotherapist for Disease Control and Prevention), STEADI: [email or web address]  [facility] el Envejecimiento [employer] on Aging): RingConnections.si Comunquese con un mdico si:  Tiene miedo de caerse en su casa.  Se siente dbil, somnoliento o mareado en su casa.  Se cae en su casa. Resumen  Hay muchas cosas simples que puede hacer para que su casa sea un lugar seguro y ayudar a prevenir las cadas.  Algunas formas de garantizar la seguridad en su casa incluyen eliminar cosas que representen un riesgo de tropiezo e instalar barras para sostn en el bao.  Pida ayuda cuando haga estos cambios en su  hogar. Esta informacin no tiene Theme park manager el consejo del mdico. Asegrese de hacerle al mdico cualquier pregunta que tenga. Document Revised: 05/21/2017 Document Reviewed: 12/21/2016 Elsevier Patient Education  2020 [employer].

## 2019-11-24 NOTE — Progress Notes (Signed)
Occupational Therapy Treatment Patient Details Name: Cynthia Foley MRN: [id number] DOB: [birth year] Today's Date: 11/24/2019    History of present illness This is a 86 year old female with a history of HTN presenting after a mechanical fall down stairs that occurred after walking to the bathroom at night. CT revealed 7 mm subdural hematoma, no midline shift, mass effect or neurological deficits noted.   OT comments  Pt. Seen for skilled OT treatment session.  Utilization of tele interpreter [phone] throughout session.  Pt. Agreeable to participation despite c/o dizziness and nausea.  (rn notified).  Able to complete in room ambulation for use of 3n1 for toileting. Min guard/min a mostly for cues for rw management and hand placement during transfers.    BP: 143/59-83   P: 90 (taken seated after ambulation back from bsc)    Follow Up Recommendations  Supervision/Assistance - 24 hour;Home health OT    Equipment Recommendations  3 in 1 bedside commode;Other (comment)    Recommendations for Other Services      Precautions / Restrictions Precautions Precautions: Fall Restrictions Weight Bearing Restrictions: No       Mobility Bed Mobility               General bed mobility comments: seated in recliner at beginning and end of session  Transfers Overall transfer level: Needs assistance Equipment used: Rolling walker (2 wheeled) Transfers: Sit to/from Stand Sit to Stand: Min assist         General transfer comment: min a with cues for hand placement during transitional movements along with rw management    Balance                                           ADL either performed or assessed with clinical judgement   ADL Overall ADL's : Needs assistance/impaired                         Toilet Transfer: Minimal assistance;RW;BSC;Ambulation;Min guard Toilet Transfer Details (indicate cue type and reason): cues for rw management and hand  placement during sit/stand,stand/sit Toileting- Clothing Manipulation and Hygiene: Set up;Sitting/lateral lean         General ADL Comments: minguard/min a for bsc transfer, cues required for hand placement     Vision       Perception     Praxis      Cognition Arousal/Alertness: Awake/alert Behavior During Therapy: WFL for tasks assessed/performed Overall Cognitive Status: Difficult to assess                                          Exercises     Shoulder Instructions       General Comments      Pertinent Vitals/ Pain       Pain Assessment: Faces Faces Pain Scale: Hurts a little bit Pain Location: dizziness and nausea Pain Descriptors / Indicators: Aching  Home Living                                          Prior Functioning/Environment              Frequency  Progress Toward Goals  OT Goals(current goals can now be found in the care plan section)  Progress towards OT goals: Progressing toward goals     Plan      Co-evaluation                 AM-PAC OT "6 Clicks" Daily Activity     Outcome Measure   Help from another person eating meals?: None Help from another person taking care of personal grooming?: A Little Help from another person toileting, which includes using toliet, bedpan, or urinal?: A Little Help from another person bathing (including washing, rinsing, drying)?: A Little Help from another person to put on and taking off regular upper body clothing?: A Little Help from another person to put on and taking off regular lower body clothing?: A Little 6 Click Score: 19    End of Session Equipment Utilized During Treatment: Gait belt;Rolling walker  OT Visit Diagnosis: Unsteadiness on feet (R26.81);History of falling (Z91.81);Pain   Activity Tolerance Patient tolerated treatment well   Patient Left in chair;with call bell/phone within reach;with chair alarm set   Nurse  Communication Other (comment) (reviewed with RN pt. request for dressing change on her head, also c/o dizziness)        Time: 2197-5883 OT Time Calculation (min): 23 min  Charges: OT General Charges $OT Visit: 1 Visit OT Treatments $Self Care/Home Management : 23-37 mins  Boneta Lucks, COTA/L Acute Rehabilitation [phone]   Robet Leu 11/24/2019, 9:47 AM

## 2019-11-24 NOTE — Discharge Summary (Signed)
Name: Cynthia Foley MRN: [id number] DOB: June 12, 1930 86 y.o. PCP: Patient, No Pcp Per  Date of Admission: 11/22/2019  6:36 AM Date of Discharge:  Attending Physician: Miguel Aschoff, MD  Discharge Diagnosis: 1. Subdural Hematoma Discharge Medications: Allergies as of 11/24/2019      Reactions   Lactose Intolerance (gi) Other (See Comments)   Upset stomach       Medication List    TAKE these medications   losartan 50 MG tablet Commonly known as: COZAAR Take 50 mg by mouth at bedtime.   OVER THE COUNTER MEDICATION Take 1 tablet by mouth daily. Neurobion; Vitamins B1 + B6 + B12 all in one tablet.            Durable Medical Equipment  (From admission, onward)         Start     Ordered   11/23/19 1636  For home use only DME 3 n 1  Once        11/23/19 1636   11/23/19 1636  For home use only DME Walker rolling  Once       Question Answer Comment  Walker: With 5 Inch Wheels   Patient needs a walker to treat with the following condition Falls      11/23/19 1636          Disposition and follow-up:   [name] was discharged from [facility] in Stable condition.  At the hospital follow up visit please address:  1.  Follow up with patient to remove her staples and reassess her nausea and balance.  2.  Labs / imaging needed at time of follow-up: None  3.  Pending labs/ test needing follow-up: None  Follow-up Appointments:  Follow-up Information    [facility]-COVID [facility] at [facility] Call in 1 day.   Specialty: Family Medicine Why: Angus Seller Contact information: [address] [place] [address] [phone]       [facility]. Go to.   Why: September 23rd at 8:45 Contact information: [address] [place] [address] [phone]              Hospital Course by problem list: [name] is a 86 yo female with a PMH of peripheral neuropathy 2/2 low Vit B12,  GERD, and HTN. Patient is from [place] and visiting family in [place] this month. She presented to the hospital after a fall down 10-15 stairs at her son's home after coming from the bathroom in the dark.  Subdural hematoma following a likely mechanical fall Upon presentation to ED neurological exam was intact. Initially Cleviprex was ordered due to patient HTN; however it resolved on its own and the medication was not necessary. XR lumbar spine was only significant for degenerative change with mild anterolisthesis of L4 and L5. CT cervical spine was not significant for an acute bony abnormality. CT head revealed 81mm subdural hematoma in the left parietal region with no midline shift or mass effect noted. Repeat CT in the AM showed no change. Neurosurgery evaluated the patient and did not feel need for surgical intervention. Patient continued to have stable vitals and exam. Patient did have nausea associated with meals. Patient was provided relief with Zofran and also found lemons helpful. She was able to eat without nausea on day of discharge. Patient initally had double vision when sitting up but this went away as well. Patient continued to have photophobia from bright light. PT and OT  evalauted patient. They both concluded that patient would need 24/7 supervision. Patient family confirmed that they will make sure that someone is always with her and watching her. Patient family was educated on making the house safe in relation to patient's recent fall. PT and OT also thought patient would require a walker. Patient was taught how to use the walker and feel comfortable at this time. Patient's son bought the patient a walker to use at his home.   Scalp laceration: Patient was noted to have a large stellate laceration that was actively bleeding. Patient had staples placed to close the wound. Wound was covered with gauze. Wound was cleaned in the hospital and family was also given instructions on how to keep the  wound clean at home.   L leg abrasion Patient was noted to have minimal abrasions to her left shin. Bandage was placed over the leg. X ray of the Left tibia/ fibula noted mild soft tissue swelling related to the recent injury.   Patient remained tender to palpation. Bandage was removed the area appears to be healing well.  Hypertension Patient was continued on her home losartan dose of 50mg .   Normocytic anemia Due to fall trended Hgb. There was initial drop 1.7 expected due to patient's blood loss from her head injury.Hgb then stabilized.    Discharge Vitals:   BP (!) 142/70 (BP Location: Right Arm)   Pulse 87   Temp 98.7 F (37.1 C) (Oral)   Resp 18   Ht 5\' 2"  (1.575 m)   Wt 74.6 kg   LMP  (LMP Unknown)   SpO2 97%   BMI 30.08 kg/m   Pertinent Labs, Studies, and Procedures:  DG Lumbar Spine Complete  Result Date: 11/22/2019 CLINICAL DATA:  Recent fall downstairs with lower back pain, initial encounter EXAM: LUMBAR SPINE - COMPLETE 4+ VIEW COMPARISON:  None. FINDINGS: Five lumbar type vertebral bodies are well visualized. Vertebral body height is well maintained. Mild degenerative anterolisthesis of L4 on L5 is noted. Mild osteophytic changes are seen. Facet hypertrophic changes are noted. Diffuse aortic calcifications are seen without aneurysmal dilatation. IMPRESSION: Degenerative change with mild anterolisthesis of L4 on L5. Electronically Signed   By: M.D.   On: 11/22/2019 08:26   DG Tibia/Fibula Left  Result Date: 11/22/2019 CLINICAL DATA:  Recent fall with ankle pain, initial encounter EXAM: LEFT TIBIA AND FIBULA - 2 VIEW COMPARISON:  None. FINDINGS: Soft tissue swelling is noted along the anterior aspect of the distal lower leg. Degenerative changes of the knee joint are seen. No acute fracture or dislocation is noted. IMPRESSION: Degenerative change without acute bony abnormality. Mild distal soft tissue swelling is noted related to the recent injury.  Electronically Signed   By: 11/24/2019 M.D.   On: 11/22/2019 08:25   CT HEAD WO CONTRAST  Result Date: 11/23/2019 CLINICAL DATA:  Subdural hematoma. EXAM: CT HEAD WITHOUT CONTRAST TECHNIQUE: Contiguous axial images were obtained from the base of the skull through the vertex without intravenous contrast. COMPARISON:  November 22, 2019. FINDINGS: Brain: Mild chronic ischemic white matter disease is noted. Stable small left parietal subdural hematoma is noted. No significant mass effect or midline shift is noted. Ventricular size is within normal limits. No mass lesion or acute infarction is noted. Vascular: No hyperdense vessel or unexpected calcification. Skull: Normal. Negative for fracture or focal lesion. Sinuses/Orbits: No acute finding. Other: Small left posterior parietal scalp hematoma is noted. IMPRESSION: Stable small left parietal subdural hematoma. Small  left posterior parietal scalp hematoma. Mild chronic ischemic white matter disease. Electronically Signed   By: Lupita Raider M.D.   On: 11/23/2019 08:57   CT HEAD WO CONTRAST  Result Date: 11/22/2019 CLINICAL DATA:  Fall down stairs.  Posterior head laceration. EXAM: CT HEAD WITHOUT CONTRAST TECHNIQUE: Contiguous axial images were obtained from the base of the skull through the vertex without intravenous contrast. COMPARISON:  None. FINDINGS: Brain: There is an extra-axial hyperdense fluid collection noted in the left parietal region best seen on coronal imaging measuring up to 7 mm in thickness compatible with subdural hematoma. No mass effect or midline shift. No intra parenchymal hemorrhage. Age related volume loss. No hydrocephalus. Vascular: No hyperdense vessel or unexpected calcification. Skull: No acute calvarial abnormality. Sinuses/Orbits: Visualized paranasal sinuses and mastoids clear. Orbital soft tissues unremarkable. Other: Soft tissue swelling and scalp laceration noted in the posterior left parietal region. IMPRESSION: 7 mm  left subdural hematoma in the left parietal region. No mass effect or midline shift. Critical Value/emergent results were called by telephone at the time of interpretation on 11/22/2019 at 8:44 am to provider [facility] , who verbally acknowledged these results. Electronically Signed   By: Charlett Nose M.D.   On: 11/22/2019 08:46   CT CERVICAL SPINE WO CONTRAST  Result Date: 11/22/2019 CLINICAL DATA:  Fall down steps.  Hit head. EXAM: CT CERVICAL SPINE WITHOUT CONTRAST TECHNIQUE: Multidetector CT imaging of the cervical spine was performed without intravenous contrast. Multiplanar CT image reconstructions were also generated. COMPARISON:  None. FINDINGS: Alignment: Degenerative listhesis noted at C3-4 and C4-5. Skull base and vertebrae: No acute fracture. No primary bone lesion or focal pathologic process. Soft tissues and spinal canal: No prevertebral fluid or swelling. No visible canal hematoma. Disc levels: Diffuse degenerative disc disease. Advanced diffuse degenerative facet disease. Upper chest: Biapical scarring. Other: Negative IMPRESSION: Degenerative disc and facet disease.  No acute bony abnormality. Electronically Signed   By: Charlett Nose M.D.   On: 11/22/2019 08:47    Discharge Instructions: Discharge Instructions    Call MD for:  difficulty breathing, headache or visual disturbances   Complete by: As directed    Call MD for:  extreme fatigue   Complete by: As directed    Call MD for:  hives   Complete by: As directed    Call MD for:  persistant dizziness or light-headedness   Complete by: As directed    Call MD for:  persistant nausea and vomiting   Complete by: As directed    Call MD for:  redness, tenderness, or signs of infection (pain, swelling, redness, odor or green/yellow discharge around incision site)   Complete by: As directed    Call MD for:  severe uncontrolled pain   Complete by: As directed    Call MD for:  temperature >100.4   Complete by: As directed    Diet -  low sodium heart healthy   Complete by: As directed    Increase activity slowly   Complete by: As directed       Signed: Bobbye Morton, MD 11/24/2019, 3:06 PM   Pager: [phone]

## 2019-11-27 ENCOUNTER — Emergency Department (HOSPITAL_COMMUNITY)
Admission: EM | Admit: 2019-11-27 | Discharge: 2019-11-28 | Disposition: A | Discharge status: Home / Self Care | Payer: Self-pay | Attending: Emergency Medicine | Admitting: Emergency Medicine

## 2019-11-27 ENCOUNTER — Encounter (HOSPITAL_COMMUNITY): Payer: Self-pay | Admitting: Emergency Medicine

## 2019-11-27 ENCOUNTER — Emergency Department (HOSPITAL_COMMUNITY): Payer: Self-pay

## 2019-11-27 ENCOUNTER — Other Ambulatory Visit: Payer: Self-pay

## 2019-11-27 DIAGNOSIS — I10 Essential (primary) hypertension: Secondary | ICD-10-CM | POA: Insufficient documentation

## 2019-11-27 DIAGNOSIS — R519 Headache, unspecified: Secondary | ICD-10-CM | POA: Insufficient documentation

## 2019-11-27 DIAGNOSIS — F0781 Postconcussional syndrome: Secondary | ICD-10-CM | POA: Insufficient documentation

## 2019-11-27 HISTORY — DX: Essential (primary) hypertension: I10

## 2019-11-27 NOTE — ED Triage Notes (Signed)
Spanish speaker pt brought to ED by [facility] from home for increase HA. Pt was seen 4 days ago for a fall and had staples on her left side of her head, pt states she has increase left side headache since then unable to sleep. Pt is AO x 4 no neuro deficit noticed on triage.

## 2019-11-28 MED ORDER — ONDANSETRON 4 MG PO TBDP
4.0000 mg | ORAL_TABLET | Freq: Once | ORAL | Status: AC
  Administered 2019-11-28: 4 mg via ORAL
  Filled 2019-11-28: qty 1

## 2019-11-28 MED ORDER — ACETAMINOPHEN 325 MG PO TABS
650.0000 mg | ORAL_TABLET | Freq: Once | ORAL | Status: AC
  Administered 2019-11-28: 650 mg via ORAL
  Filled 2019-11-28: qty 2

## 2019-11-28 MED ORDER — ONDANSETRON 4 MG PO TBDP: 4.0000 mg | ORAL_TABLET | Freq: Three times a day (TID) | ORAL | 0 refills | Status: AC | PRN

## 2019-11-28 NOTE — ED Provider Notes (Signed)
Attestation: Medical screening examination/treatment/procedure(s) were conducted as a shared visit with non-physician practitioner(s) and myself.  I personally evaluated the patient during the encounter.   Briefly, the patient is a 86 y.o. female with h/o recent admission for fall down stair resulting in SDH, here for persistently fluctuating headache with photophobia and N/V since admission.   Vitals:   11/27/19 1606 11/27/19 2058  BP: 112/63 (!) 121/55  Pulse: 78 70  Resp: 18 18  Temp: 98.5 F (36.9 C)   SpO2: 98% 100%    CONSTITUTIONAL:  well-appearing, NAD NEURO:  Alert and oriented x 3, no focal deficits EYES:  pupils equal and reactive ENT/NECK:  trachea midline, no JVD CARDIO:  reg rate, reg rhythm, well-perfused PULM:  Reg labored breathing GI/GU:  Abdomen non-distended MSK/SPINE:  No gross deformities, no edema SKIN:  no rash, left occiput laceration closed with staples in place. No redness or discharge. PSYCH:  Appropriate speech and behavior   EKG Interpretation  Date/Time:    Ventricular Rate:    PR Interval:    QRS Duration:   QT Interval:    QTC Calculation:   R Axis:     Text Interpretation:         Work up with stable CT head. Symptoms are consistent with post concussive syndrome. Treated symptomatically here. Tolerated PO.  The patient appears reasonably screened and/or stabilized for discharge and I doubt any other medical condition or other [facility] requiring further screening, evaluation, or treatment in the ED at this time prior to discharge. Safe for discharge with strict return precautions.       Nira Conn, MD 11/28/19 [phone]

## 2019-11-28 NOTE — Discharge Instructions (Addendum)
Take Zofran for nausea as directed, as needed. Drinking lots of fluids will help with symptoms as well. Continue Tylenol for headache pain.   Return to the emergency department with any new or concerning symptoms.

## 2019-11-28 NOTE — ED Provider Notes (Signed)
[facility] EMERGENCY DEPARTMENT Provider Note   CSN: [id number] Arrival date & time: 11/27/19  1547     History Chief Complaint  Patient presents with  . Headache    Cynthia Foley is a 86 y.o. female.  Patient to ED with complaint of severe headache. Recent admission for subdural hemorrhage after a mechanical fall, discharged home on 11/24/19. She states that since discharge she has had a severe headache associated with nausea and vomiting. She has been able to eat or drink very little secondary to nausea. She reports photophobia, walking with some sense of being off balance, soreness at the site of laceration but also a more generalized headache. No fever. She initially have double vision but this has resolved.   The history is provided by the patient. A language interpreter was used.       Past Medical History:  Diagnosis Date  . Hypertension     Patient Active Problem List   Diagnosis Date Noted  . Subdural hematoma (HCC) 11/22/2019    History reviewed. No pertinent surgical history.   OB History   No obstetric history on file.     No family history on file.  Social History   Tobacco Use  . Smoking status: Never Smoker  . Smokeless tobacco: Never Used  Vaping Use  . Vaping Use: Never used  Substance Use Topics  . Alcohol use: Not on file  . Drug use: Not on file    Home Medications Prior to Admission medications   Medication Sig Start Date End Date Taking? Authorizing Provider  losartan (COZAAR) 50 MG tablet Take 50 mg by mouth at bedtime.    [provider]  OVER THE COUNTER MEDICATION Take 1 tablet by mouth daily. Neurobion; Vitamins B1 + B6 + B12 all in one tablet.    [provider]    Allergies    Lactose intolerance (gi)  Review of Systems   Review of Systems  Constitutional: Negative for chills and fever.  HENT: Negative.   Eyes: Positive for photophobia. Negative for visual disturbance.  Respiratory:  Negative.   Cardiovascular: Negative.   Gastrointestinal: Positive for nausea and vomiting.  Genitourinary: Negative.   Musculoskeletal: Positive for neck pain (Persistent since the initial fall).  Skin: Negative.  Negative for color change.  Neurological: Positive for light-headedness and headaches. Negative for syncope, facial asymmetry and speech difficulty.    Physical Exam Updated Vital Signs BP (!) 121/55 (BP Location: Left Arm)   Pulse 70   Temp 98.5 F (36.9 C) (Oral)   Resp 18   Ht 5\' 2"  (1.575 m)   Wt 74.6 kg   LMP  (LMP Unknown)   SpO2 100%   BMI 30.08 kg/m   Physical Exam Vitals and nursing note reviewed.  Constitutional:      Appearance: She is well-developed.  HENT:     Head: Normocephalic.     Comments: Stapled wound to left scalp. No redness or discharge.  Eyes:     General: No visual field deficit. Cardiovascular:     Rate and Rhythm: Normal rate.  Pulmonary:     Effort: Pulmonary effort is normal.  Abdominal:     Palpations: Abdomen is soft.     Tenderness: There is no abdominal tenderness.  Musculoskeletal:        General: Normal range of motion.     Cervical back: Normal range of motion and neck supple.  Skin:    General: Skin is warm and  dry.  Neurological:     Mental Status: She is alert and oriented to person, place, and time.     GCS: GCS eye subscore is 4. GCS verbal subscore is 5. GCS motor subscore is 6.     Cranial Nerves: No dysarthria or facial asymmetry.     Sensory: No sensory deficit.     Motor: No weakness.     Coordination: Coordination normal.  Psychiatric:        Mood and Affect: Mood normal.     ED Results / Procedures / Treatments   Labs (all labs ordered are listed, but only abnormal results are displayed) Labs Reviewed - No data to display  EKG None  Radiology CT Head Wo Contrast  Result Date: 11/27/2019 CLINICAL DATA:  Headache, head trauma. EXAM: CT HEAD WITHOUT CONTRAST TECHNIQUE: Contiguous axial images  were obtained from the base of the skull through the vertex without intravenous contrast. COMPARISON:  11/23/2019 FINDINGS: Brain: Previously seen high-density subdural hematoma in the left posterior parietal region demonstrates evolutionary changes and measures approximately 5 mm in thickness, similar in thickness to prior study. No significant mass effect or midline shift. No new areas of hemorrhage. No acute infarction. There is atrophy and chronic small vessel disease changes. Vascular: No hyperdense vessel or unexpected calcification. Skull: No acute calvarial abnormality. Sinuses/Orbits: Visualized paranasal sinuses and mastoids clear. Orbital soft tissues unremarkable. Other: None IMPRESSION: Evolutionary changes within the small left parietal subdural hematoma. This is similar thickness to prior study. No significant mass effect or midline shift. No new areas of acute hemorrhage or infarction. Electronically Signed   By: Charlett Nose M.D.   On: 11/27/2019 16:42    Procedures Procedures (including critical care time)  Medications Ordered in ED Medications  ondansetron (ZOFRAN-ODT) disintegrating tablet 4 mg (has no administration in time range)  acetaminophen (TYLENOL) tablet 650 mg (has no administration in time range)    ED Course  I have reviewed the triage vital signs and the nursing notes.  Pertinent labs & imaging results that were available during my care of the patient were reviewed by me and considered in my medical decision making (see chart for details).    MDM Rules/Calculators/A&P                          Patient to ED with ss/sxs as per HPI.   She is awake, in NAD, VSS. Spanish speaking, interpreter used. Chart reviewed.   CT scan repeated tonight and shows no worsening subdural. Neuro exam is unremarkable, without deficit. Headache after significant head injury, nausea, vomiting, photophobia suggests post-concussive syndrome. Stable otherwise. Zofran for nausea  provided. Will PO challenge and reassess.  Patient tolerated PO without vomiting. Sleeping on reassessment. Son at bedside and is updated on condition.   She is felt appropriate for discharge home in the care of her son.    Final Clinical Impression(s) / ED Diagnoses Final diagnoses:  None   1. Post-concussive syndrome  Rx / DC Orders ED Discharge Orders    None       Elpidio Anis, PA-C 11/28/19 0244    Nira Conn, MD 11/30/19 [phone]

## 2019-11-29 ENCOUNTER — Telehealth: Payer: Self-pay

## 2019-11-29 NOTE — Telephone Encounter (Signed)
Dr. Sande Brothers, A Rx request was received via fax from pharmacy for Losartan Potassium 50mg   (No Instructions on dosage included)  Patient has no PCP listed, but has an appt with you tomorrow at 0845.  I wanted to let you know in the event patient decides to establish care with you. Thank you, SChaplin, RN,BSN

## 2019-11-30 ENCOUNTER — Encounter: Payer: Self-pay | Admitting: Internal Medicine

## 2019-11-30 ENCOUNTER — Ambulatory Visit (INDEPENDENT_AMBULATORY_CARE_PROVIDER_SITE_OTHER): Payer: Self-pay | Admitting: Internal Medicine

## 2019-11-30 DIAGNOSIS — S065X9A Traumatic subdural hemorrhage with loss of consciousness of unspecified duration, initial encounter: Secondary | ICD-10-CM

## 2019-11-30 DIAGNOSIS — W108XXA Fall (on) (from) other stairs and steps, initial encounter: Secondary | ICD-10-CM

## 2019-11-30 DIAGNOSIS — S065XAA Traumatic subdural hemorrhage with loss of consciousness status unknown, initial encounter: Secondary | ICD-10-CM

## 2019-11-30 MED ORDER — OXYCODONE HCL 5 MG PO TABS: 5.0000 mg | ORAL_TABLET | Freq: Three times a day (TID) | ORAL | 0 refills | Status: AC | PRN

## 2019-11-30 NOTE — Progress Notes (Signed)
   CC: Subdural hematoma   HPI:  [name] is a 86 y.o. female, with a PMH noted below, who presents to the clinic for follow up on her subdural hematoma. To see the management of her acute and chronic care, please see the A&P note under the encounter tab.   Past Medical History:  Diagnosis Date  . Hypertension    Review of Systems:  Review of Systems  Constitutional: Negative for chills, fever and weight loss.  Eyes: Negative for blurred vision and double vision.  Respiratory: Negative for cough, hemoptysis and sputum production.   Cardiovascular: Negative for palpitations.  Gastrointestinal: Positive for nausea. Negative for abdominal pain, constipation, diarrhea and vomiting.  Musculoskeletal: Positive for back pain.  Neurological: Positive for headaches. Negative for dizziness, tingling and tremors.   Physical Exam:  Vitals:   11/30/19 0906  BP: (!) 131/55  Pulse: 90  SpO2: 98%  Weight: 155 lb 14.4 oz (70.7 kg)   Physical Exam Vitals reviewed.  Constitutional:      General: She is not in acute distress.    Appearance: Normal appearance. She is not ill-appearing or toxic-appearing.  HENT:     Head:     Comments: Laceration on the crown of the head, matted hair blood, and gauze obscuring multiple staples. No sign of erythema, purulence, or discharge.  Cardiovascular:     Rate and Rhythm: Normal rate and regular rhythm.     Pulses: Normal pulses.     Heart sounds: Normal heart sounds. No murmur heard.  No friction rub. No gallop.   Pulmonary:     Effort: Pulmonary effort is normal.     Breath sounds: Normal breath sounds. No wheezing, rhonchi or rales.  Abdominal:     General: Abdomen is flat. Bowel sounds are normal.     Palpations: Abdomen is soft.     Tenderness: There is no abdominal tenderness. There is no guarding.  Neurological:     Mental Status: She is alert and oriented to person, place, and time.  Psychiatric:        Behavior: Behavior normal.       Assessment & Plan:   See Encounters Tab for problem based charting.  Patient seen with Dr. Heide Spark

## 2019-11-30 NOTE — Assessment & Plan Note (Addendum)
Patient presenting for hospital follow up for 20mm subdural hematoma 2/2l from her mechanical fall down a flight of 10-15 stairs. She presents today for staple removal. She continues to endorse occasional nausea and dizziness, she was evaluated in the ED on 11/28/2019 and was diagnosed with post concussive syndrome. She is currently taking zofran which appears to help. She additionally has complaints of back pain which she has oxycodone for pain control. She is otherwise doing well with no other complaints at this time.   On physical examination, the staple site is in poor condition, coagulated blood is noted throughout the site. Patient's hair is matted. No signs of erythema, purulence, or malodor. Her wound was irrigated with normal saline and 9 staples were removed in total. Patient tolerated removal well. Given back pain will give a short course of her oxycodone.  - Oxycodone 5 mg daily refill for 3 days - Zofran as needed

## 2019-11-30 NOTE — Patient Instructions (Addendum)
To Ms. Cynthia Foley,  It was nice to meet you. Today we removed your staples from your head. Please gently wash the area when take a bath with soap and warm water. If you begin to notice change in mental status, fevers, or worsening symptoms please call the clinic.  Sincerely,  Dolan Amen, MD I used Google translate  A la Sra. Cynthia Foley un Dietitian. Hoy te quitamos las grapas de la [place]. Lave suavemente el rea cuando se bae con jabn y agua tibia. Si comienza a notar un cambio en el estado mental, fiebre o sntomas que empeoran, llame a la clnica. Atentamente, Dolan Amen, doctor en [place] el traductor de [employer]

## 2019-12-01 NOTE — Progress Notes (Signed)
Internal Medicine Clinic Attending  I saw and evaluated the patient.  I personally confirmed the key portions of the history and exam documented by Dr. Winters and I reviewed pertinent patient test results.  The assessment, diagnosis, and plan were formulated together and I agree with the documentation in the resident's note.  

## 2021-06-28 IMAGING — CT CT CERVICAL SPINE W/O CM
4 of 7 series · 14 of 33 positions shown, 15 images · non-contrast
Comparison: None.

CLINICAL DATA: Fall down steps.  Hit head.

EXAM:
CT CERVICAL SPINE WITHOUT CONTRAST
TECHNIQUE: Multidetector CT imaging of the cervical spine was performed without
intravenous contrast. Multiplanar CT image reconstructions were also
generated.

[Series 9: c_spine 2.0 st · axial · 0.25mm/px · z∈[-238,-130]mm · 4 of 92 slices shown, 5 images]
[im 19/92  soft-tissue]
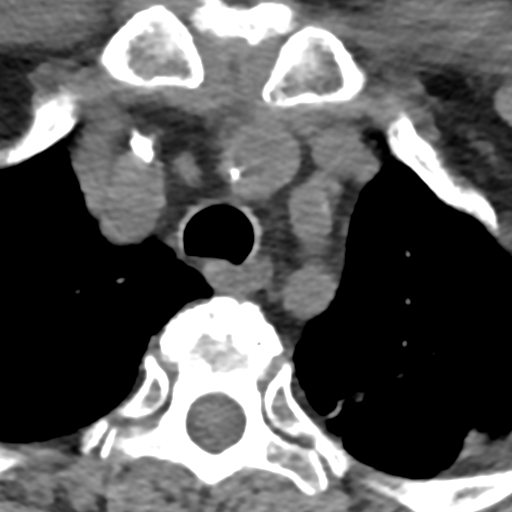
[im 19/92  bone]
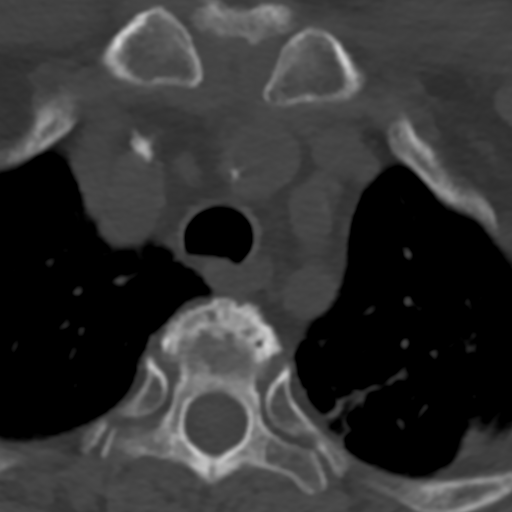
[im 37/92  bone]
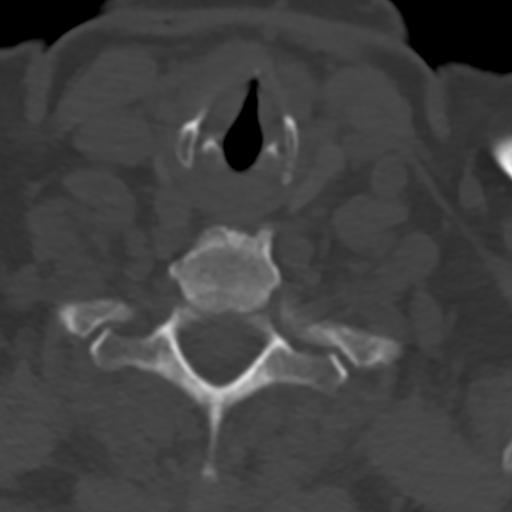
[im 55/92  bone]
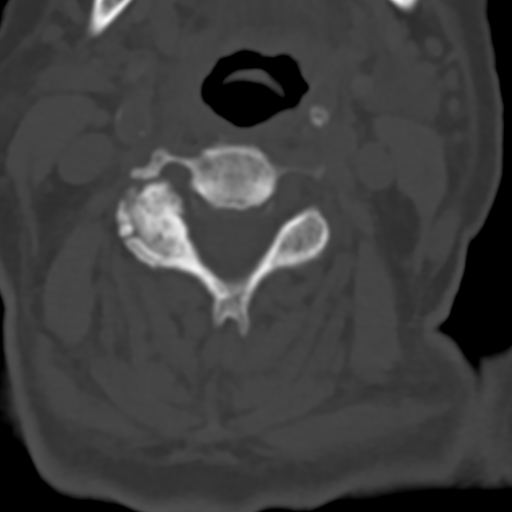
[im 73/92  bone]
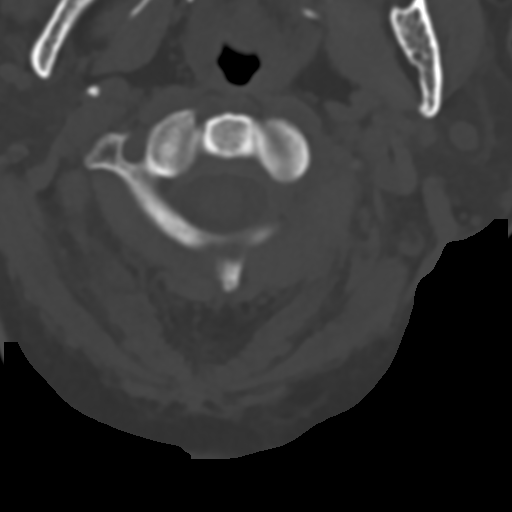

[Series 10: coronal bone · coronal · 0.23mm/px · 1 of 53 slices shown]
[im 27/53  bone]
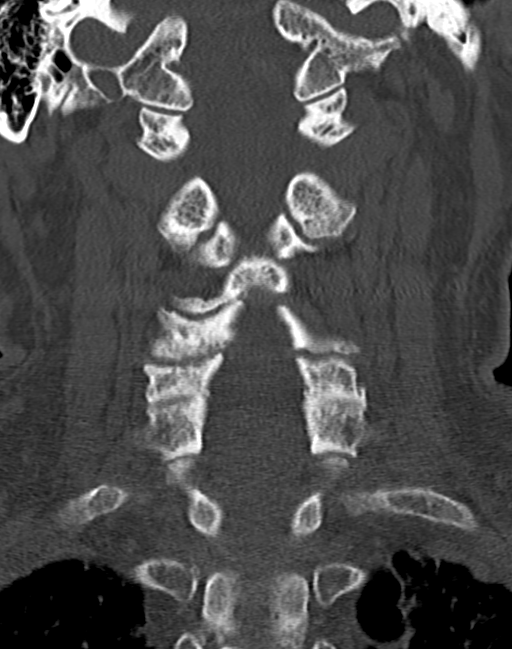

[Series 11: sagittal bone · sagittal · 0.21mm/px · 5 of 61 slices shown]
[im 11/61  bone]
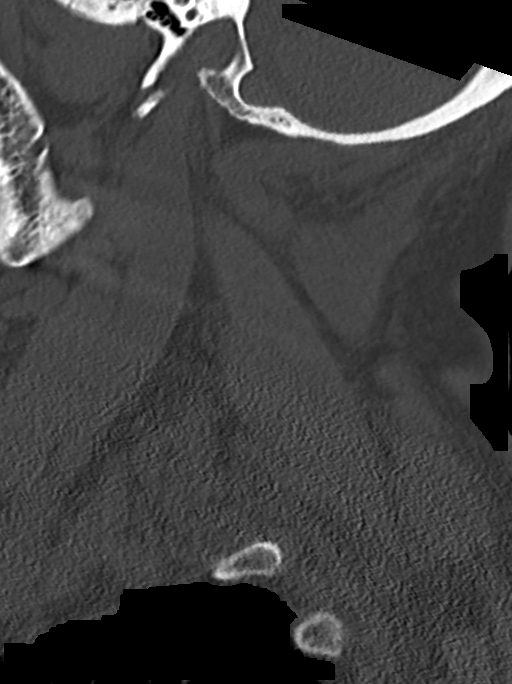
[im 21/61  bone]
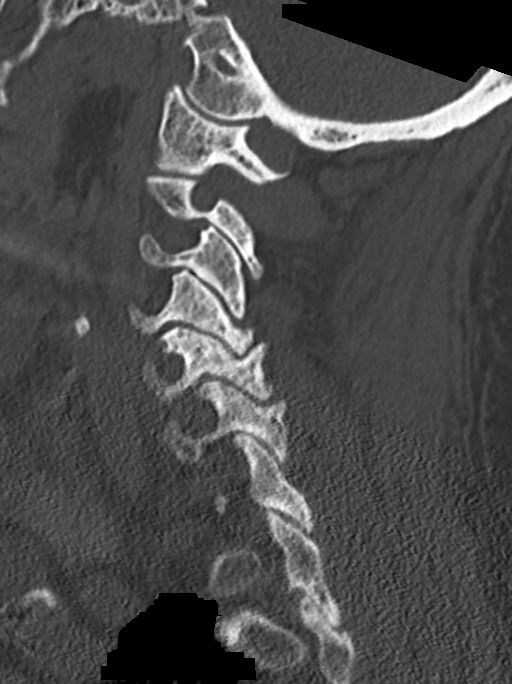
[im 31/61  bone]
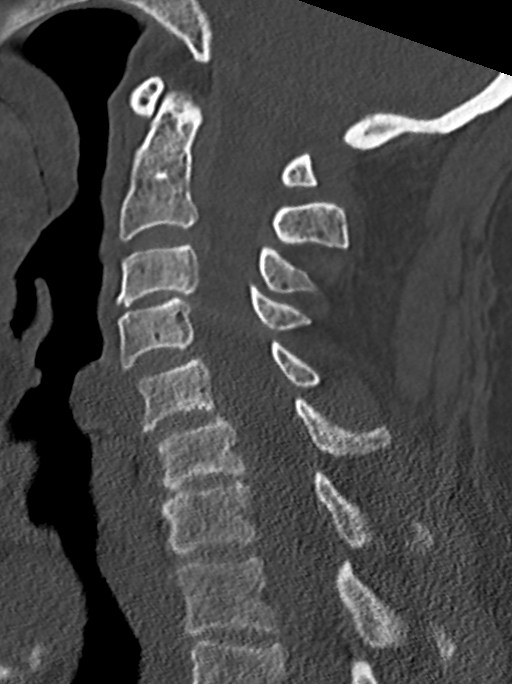
[im 41/61  bone]
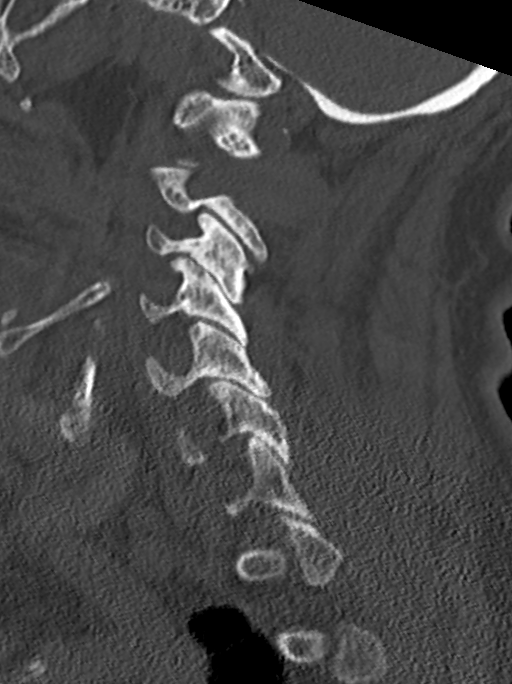
[im 51/61  bone]
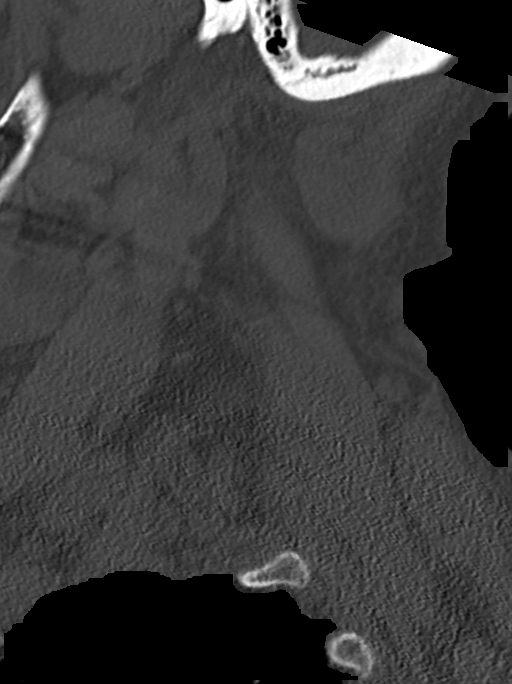

[Series 13: orthogonal axial st · axial · 0.21mm/px · z∈[-246,-152]mm · 4 of 98 slices shown]
[im 20/98  bone]
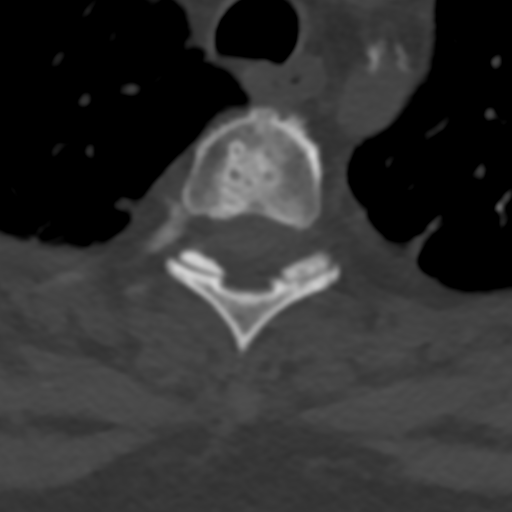
[im 39/98  bone]
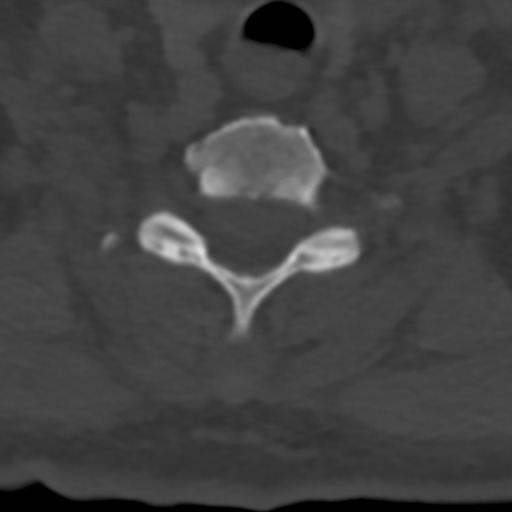
[im 59/98  bone]
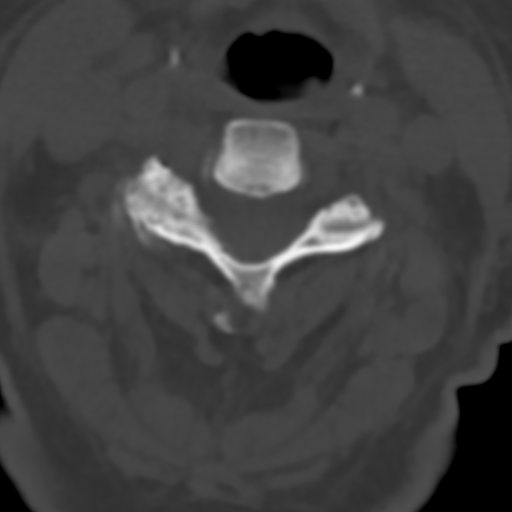
[im 78/98  bone]
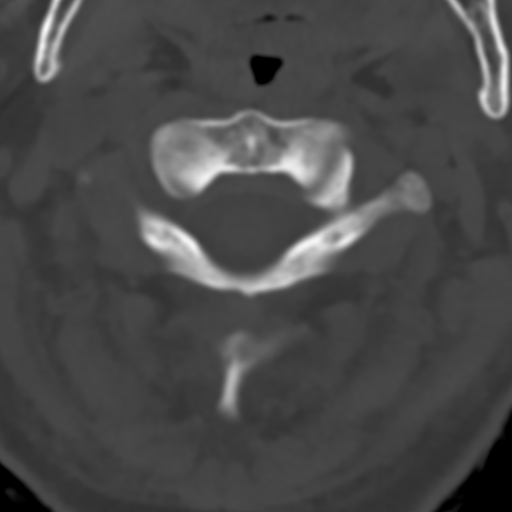

[14 of 33 positions shown; findings below may reference images not displayed]

FINDINGS: Alignment: Degenerative listhesis noted at C3-4 and C4-5.

Skull base and vertebrae: No acute fracture. No primary bone lesion
or focal pathologic process.

Soft tissues and spinal canal: No prevertebral fluid or swelling. No
visible canal hematoma.

Disc levels: Diffuse degenerative disc disease. Advanced diffuse
degenerative facet disease.

Upper chest: Biapical scarring.

Other: Negative
IMPRESSION: Degenerative disc and facet disease.  No acute bony abnormality.

## 2021-06-28 IMAGING — CT CT HEAD W/O CM
3 series · 14 of 47 positions shown, 16 images · non-contrast
Comparison: None.

CLINICAL DATA: Fall down stairs.  Posterior head laceration.

EXAM:
CT HEAD WITHOUT CONTRAST
TECHNIQUE: Contiguous axial images were obtained from the base of the skull
through the vertex without intravenous contrast.

[Series 4: head 5.0 h30s · axial · 0.42mm/px · z∈[-111,+19]mm · 8 of 32 slices shown, 10 images]
[im 3/32  brain]
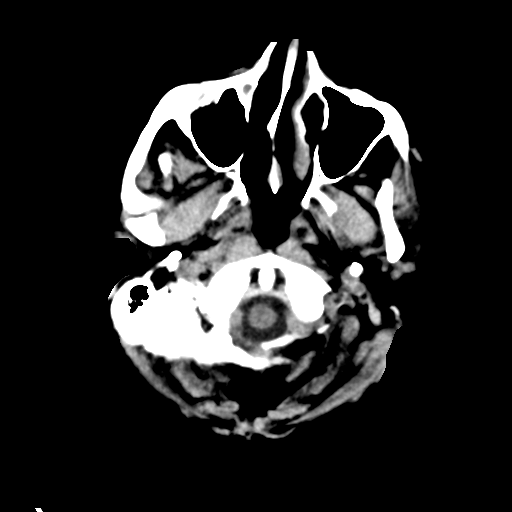
[im 3/32  bone]
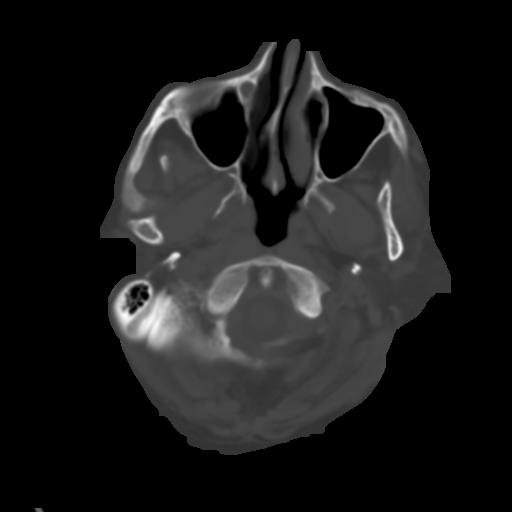
[im 7/32  brain]
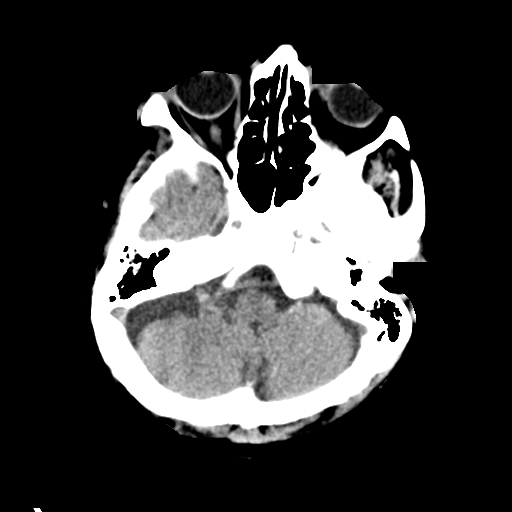
[im 10/32  brain]
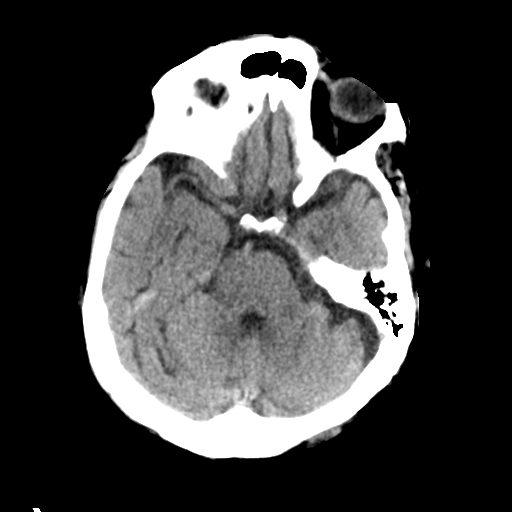
[im 14/32  brain]
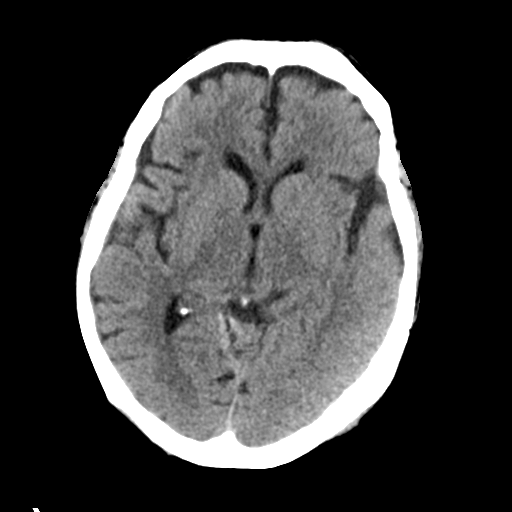
[im 18/32  brain]
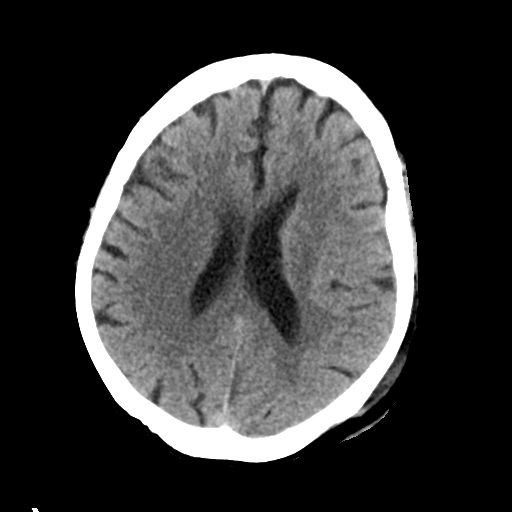
[im 18/32  bone]
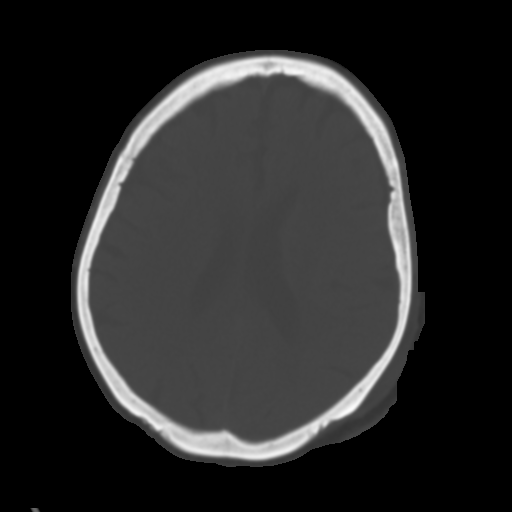
[im 22/32  brain]
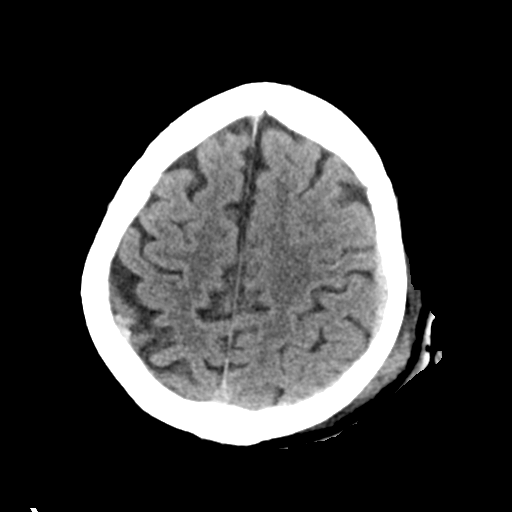
[im 25/32  brain]
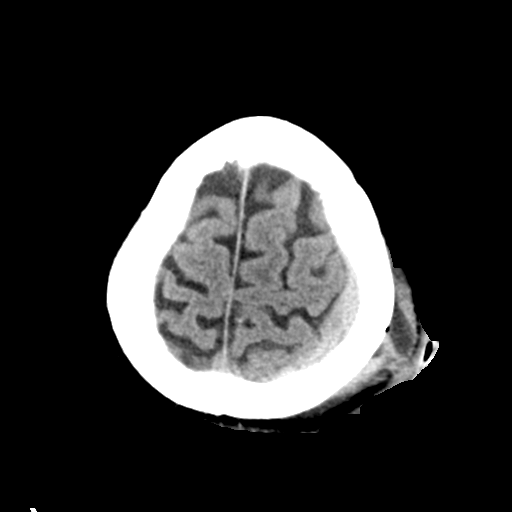
[im 29/32  brain]
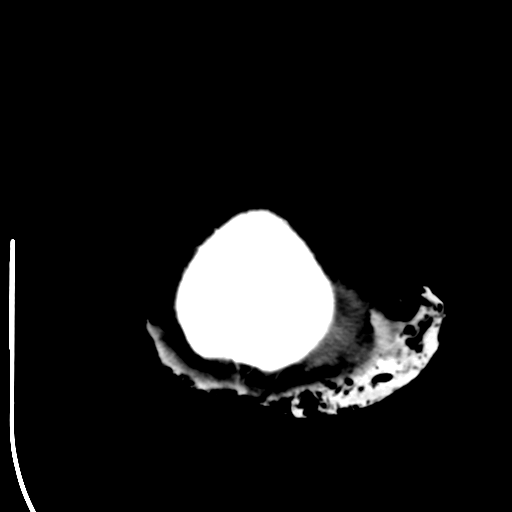

[Series 6: head 3.0 mpr cor · coronal · 0.30mm/px · 3 of 67 slices shown]
[im 23/67  brain]
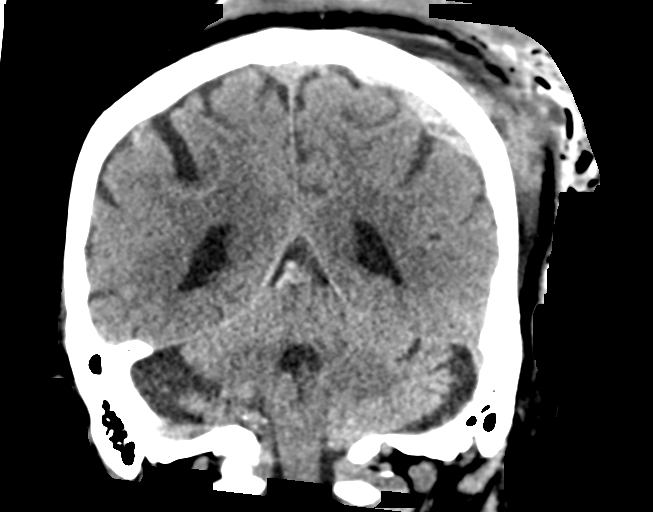
[im 30/67  brain]
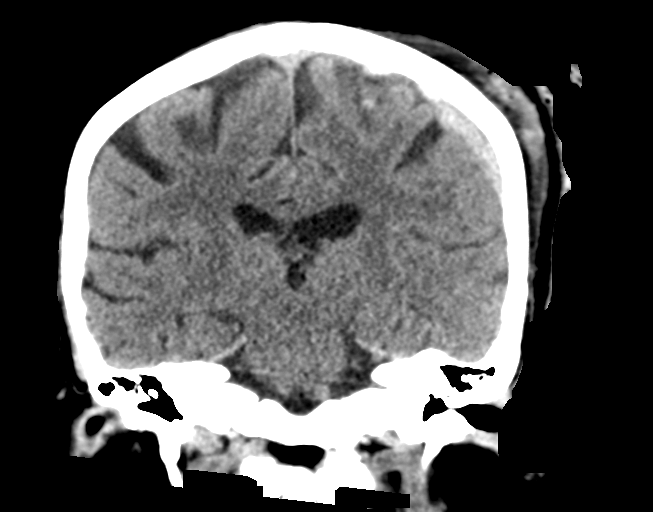
[im 37/67  brain]
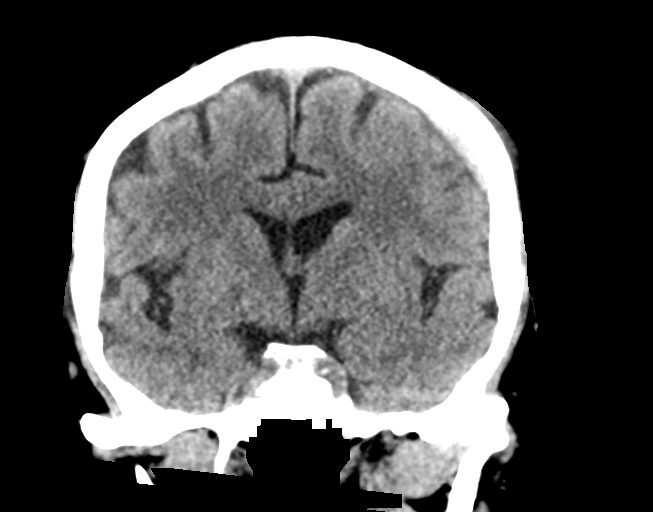

[Series 7: head 3.0 mpr sag · sagittal · 0.30mm/px · 3 of 59 slices shown]
[im 20/59  brain]
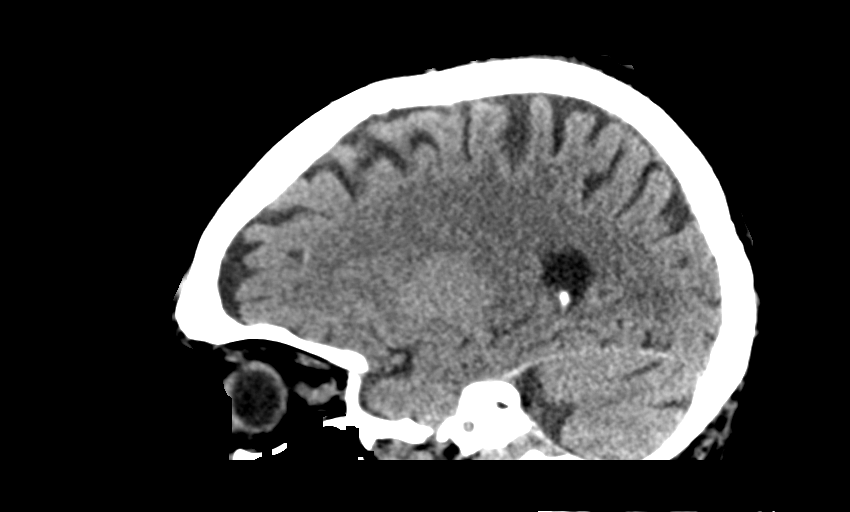
[im 30/59  brain]
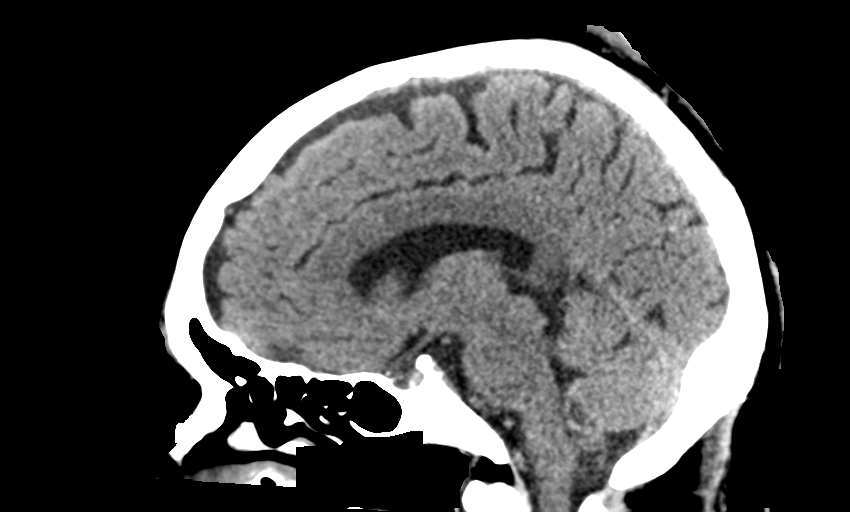
[im 39/59  brain]
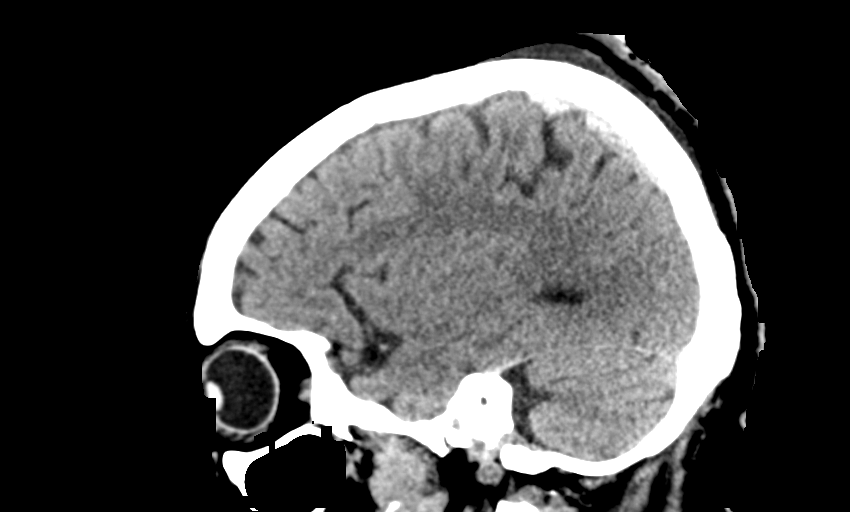

[14 of 47 positions shown; findings below may reference images not displayed]

FINDINGS: Brain: There is an extra-axial hyperdense fluid collection noted in
the left parietal region best seen on coronal imaging measuring up
to 7 mm in thickness compatible with subdural hematoma. No mass
effect or midline shift. No intra parenchymal hemorrhage. Age
related volume loss. No hydrocephalus.

Vascular: No hyperdense vessel or unexpected calcification.

Skull: No acute calvarial abnormality.

Sinuses/Orbits: Visualized paranasal sinuses and mastoids clear.
Orbital soft tissues unremarkable.

Other: Soft tissue swelling and scalp laceration noted in the
posterior left parietal region.
IMPRESSION: 7 mm left subdural hematoma in the left parietal region. No mass
effect or midline shift.

Critical Value/emergent results were called by telephone at the time
of interpretation on 11/22/2019 at [DATE] to provider ARNOUALD CONTRAIRE CONTRERA ,
who verbally acknowledged these results.

## 2021-09-18 ENCOUNTER — Encounter: Payer: Self-pay | Admitting: Emergency Medicine

## 2021-09-18 ENCOUNTER — Other Ambulatory Visit: Payer: Self-pay

## 2021-09-18 ENCOUNTER — Emergency Department: Payer: Self-pay

## 2021-09-18 DIAGNOSIS — I10 Essential (primary) hypertension: Secondary | ICD-10-CM | POA: Insufficient documentation

## 2021-09-18 DIAGNOSIS — R42 Dizziness and giddiness: Secondary | ICD-10-CM | POA: Insufficient documentation

## 2021-09-18 DIAGNOSIS — R519 Headache, unspecified: Secondary | ICD-10-CM | POA: Insufficient documentation

## 2021-09-18 LAB — CBC WITH DIFFERENTIAL/PLATELET
Abs Immature Granulocytes: 0.03 10*3/uL (ref 0.00–0.07)
Basophils Absolute: 0 10*3/uL (ref 0.0–0.1)
Basophils Relative: 0 %
Eosinophils Absolute: 0.2 10*3/uL (ref 0.0–0.5)
Eosinophils Relative: 2 %
HCT: 29.3 % — ABNORMAL LOW (ref 36.0–46.0)
Hemoglobin: 8.7 g/dL — ABNORMAL LOW (ref 12.0–15.0)
Immature Granulocytes: 0 %
Lymphocytes Relative: 31 %
Lymphs Abs: 2.4 10*3/uL (ref 0.7–4.0)
MCH: 24.6 pg — ABNORMAL LOW (ref 26.0–34.0)
MCHC: 29.7 g/dL — ABNORMAL LOW (ref 30.0–36.0)
MCV: 83 fL (ref 80.0–100.0)
Monocytes Absolute: 0.6 10*3/uL (ref 0.1–1.0)
Monocytes Relative: 8 %
Neutro Abs: 4.6 10*3/uL (ref 1.7–7.7)
Neutrophils Relative %: 59 %
Platelets: 360 10*3/uL (ref 150–400)
RBC: 3.53 MIL/uL — ABNORMAL LOW (ref 3.87–5.11)
RDW: 15.9 % — ABNORMAL HIGH (ref 11.5–15.5)
WBC: 7.7 10*3/uL (ref 4.0–10.5)
nRBC: 0 % (ref 0.0–0.2)

## 2021-09-18 LAB — BASIC METABOLIC PANEL
Anion gap: 7 (ref 5–15)
BUN: 30 mg/dL — ABNORMAL HIGH (ref 8–23)
CO2: 25 mmol/L (ref 22–32)
Calcium: 9.1 mg/dL (ref 8.9–10.3)
Chloride: 109 mmol/L (ref 98–111)
Creatinine, Ser: 0.97 mg/dL (ref 0.44–1.00)
GFR, Estimated: 56 mL/min — ABNORMAL LOW (ref >60)
Glucose, Bld: 106 mg/dL — ABNORMAL HIGH (ref 70–99)
Potassium: 5.3 mmol/L — ABNORMAL HIGH (ref 3.5–5.1)
Sodium: 141 mmol/L (ref 135–145)

## 2021-09-18 LAB — TROPONIN I (HIGH SENSITIVITY)
Troponin I (High Sensitivity): 8 ng/L (ref <18)
Troponin I (High Sensitivity): 8 ng/L (ref <18)

## 2021-09-18 NOTE — ED Provider Triage Note (Signed)
Emergency Medicine Provider Triage Evaluation Note  Wessie Shanks , a 88 y.o. female  was evaluated in triage.  Pt complains of elevated BP since Saturday. Reports headache is worse in the morning and she takes her meds and it gets better and comes back at night. Reports that it was 170 systolic. Takes BP meds AM and PM, no missed doses. Reports that she was dizzy this morning and headache and felt hot in her head.  Whenever she stands up this gets worse  Review of Systems  Positive: headache Negative: Vision changes, CP/sob, n/v  Physical Exam  There were no vitals taken for this visit. Gen:   Awake, no distress   Resp:  Normal effort  MSK:   Moves extremities without difficulty  Other:    Medical Decision Making  Medically screening exam initiated at 6:17 PM.  Appropriate orders placed.  Clothilde Tippetts was informed that the remainder of the evaluation will be completed by another provider, this initial triage assessment does not replace that evaluation, and the importance of remaining in the ED until their evaluation is complete.     Jackelyn Hoehn, PA-C 09/18/21 1826

## 2021-09-18 NOTE — ED Triage Notes (Signed)
Pt via POV from home. Pt states she was dizzy this AM, states she feels "hot" on the L side of her head stated last night. States this happens whenever she stands up and her BP is up. Pt states SBP was 170, pt does have hx of hypertension. Pt is A&Ox4 and NAD  Spanish interpreter needed.

## 2021-09-19 ENCOUNTER — Emergency Department
Admission: EM | Admit: 2021-09-19 | Discharge: 2021-09-19 | Disposition: A | Discharge status: Home / Self Care | Payer: Self-pay | Attending: Emergency Medicine | Admitting: Emergency Medicine

## 2021-09-19 DIAGNOSIS — R42 Dizziness and giddiness: Secondary | ICD-10-CM

## 2021-09-19 DIAGNOSIS — R519 Headache, unspecified: Secondary | ICD-10-CM

## 2021-09-19 NOTE — ED Provider Notes (Signed)
[facility] Provider Note    Event Date/Time   First MD Initiated Contact with Patient 09/19/21 0210     (approximate)   History   Chief Complaint Dizziness and Hypertension   HPI  Cynthia Foley is a 88 y.o. female with past medical history of hypertension who presents to the ED for dizziness and hypertension.  History is limited as patient is Spanish-speaking only and history obtained via interpreter [phone].  Patient reports that her blood pressure has been running higher than usual for the past 5 days.  She states that when she checks it at home it gets as high as 170 systolic despite her taking carvedilol twice daily.  She states that she has been dealing with a diffuse aching headache that has been gradually worsening over the past couple of days, has felt somewhat dizzy and lightheaded at times as well.  She denies any vision changes, speech changes, numbness, weakness, chest pain, or shortness of breath.  She primarily resides in [place] but is currently visiting the [place] for the summer and will be returning to [place] in August.      Physical Exam   Triage Vital Signs: ED Triage Vitals  Enc Vitals Group     BP 09/18/21 1818 (!) 129/53     Pulse Rate 09/18/21 1818 72     Resp 09/18/21 1818 18     Temp 09/18/21 1818 98.4 F (36.9 C)     Temp Source 09/18/21 1818 Oral     SpO2 09/18/21 1818 99 %     Weight 09/18/21 1824 154 lb 5.2 oz (70 kg)     Height 09/18/21 1824 5' 0.63" (1.54 m)     Head Circumference --      Peak Flow --      Pain Score 09/18/21 1824 0     Pain Loc --      Pain Edu? --      Excl. in GC? --     Most recent vital signs: Vitals:   09/19/21 0154 09/19/21 0253  BP: (!) 133/47 (!) 141/48  Pulse: 65 (!) 59  Resp: 20 18  Temp:  98.3 F (36.8 C)  SpO2: 98% 96%    Constitutional: Alert and oriented. Eyes: Conjunctivae are normal. Head: Atraumatic. Nose: No congestion/rhinnorhea. Mouth/Throat: Mucous membranes are moist.   Cardiovascular: Normal rate, regular rhythm. Grossly normal heart sounds.  2+ radial pulses bilaterally. Respiratory: Normal respiratory effort.  No retractions. Lungs CTAB. Gastrointestinal: Soft and nontender. No distention. Musculoskeletal: No lower extremity tenderness nor edema.  Neurologic:  Normal speech and language. No gross focal neurologic deficits are appreciated.    ED Results / Procedures / Treatments   Labs (all labs ordered are listed, but only abnormal results are displayed) Labs Reviewed  BASIC METABOLIC PANEL - Abnormal; Notable for the following components:      Result Value   Potassium 5.3 (*)    Glucose, Bld 106 (*)    BUN 30 (*)    GFR, Estimated 56 (*)    All other components within normal limits  CBC WITH DIFFERENTIAL/PLATELET - Abnormal; Notable for the following components:   RBC 3.53 (*)    Hemoglobin 8.7 (*)    HCT 29.3 (*)    MCH 24.6 (*)    MCHC 29.7 (*)    RDW 15.9 (*)    All other components within normal limits  TROPONIN I (HIGH SENSITIVITY)  TROPONIN I (HIGH SENSITIVITY)     EKG  ED ECG REPORT I, Chesley Noon, the attending physician, personally viewed and interpreted this ECG.   Date: 09/19/2021  EKG Time: 18:39  Rate: 73  Rhythm: normal sinus rhythm  Axis: Normal  Intervals:none  ST&T Change: None  RADIOLOGY CT head reviewed and interpreted by me with no hemorrhage or midline shift.  PROCEDURES:  Critical Care performed: No  Procedures   MEDICATIONS ORDERED IN ED: Medications - No data to display   IMPRESSION / MDM / ASSESSMENT AND PLAN / ED COURSE  I reviewed the triage vital signs and the nursing notes.                              88 y.o. female with past medical history of hypertension who presents to the ED complaining of elevated blood pressure at home along with dizziness and headache for the past 5 days.  Patient's presentation is most consistent with acute presentation with potential threat to life or  bodily function.  Differential diagnosis includes, but is not limited to, hypertensive emergency, stroke, TIA, intracranial hemorrhage, AKI, electrolyte abnormality, anemia, dehydration.  Patient well-appearing and in no acute distress, vital signs are unremarkable here in the ED with no current significant in her BP.  CT head is negative for acute process and patient has no focal neurologic deficits on exam.  Given gradually worsening headache, doubt SAH.  Additional labs are reassuring with very mild hyperkalemia and elevated BUN to creatinine ratio for which patient was offered IV fluids but declines, states she prefers to hydrate with oral fluids instead.  CBC remarkable for anemia, however this appears to be chronic and unchanged today, doubt it is contributing to her symptoms.  No evidence of hypertensive emergency at this time and patient is appropriate for discharge home with outpatient PCP follow-up.  She was provided with referral for while she remains in the [place], was counseled to return to the ED for new or worsening symptoms.  Patient and son agree with plan.      FINAL CLINICAL IMPRESSION(S) / ED DIAGNOSES   Final diagnoses:  Acute nonintractable headache, unspecified headache type  Dizziness     Rx / DC Orders   ED Discharge Orders     None        Note:  This document was prepared using Dragon voice recognition software and may include unintentional dictation errors.   Chesley Noon, MD 09/19/21 [phone]

## 2021-09-23 ENCOUNTER — Ambulatory Visit: Payer: Self-pay | Admitting: *Deleted

## 2021-09-23 NOTE — Telephone Encounter (Signed)
Summary: bp concerns / headache   The patient's son has called [facility] to schedule a new patient appointment for the patient   The patient was previously seen at [facility] for blood pressure concerns and directed to schedule a hospital follow up   The patient is continuing to experience BP concerns as well as a headache   The patient's BP top number was 137 (bottom number unknown) yesterday at 8 AM   The patient would like to speak with a member of staff when possible   The patient was not with their son at the time of call      Reason for Disposition . [1] Systolic BP  >= 130 OR Diastolic >= 80 AND [2] taking BP medications  Answer Assessment - Initial Assessment Questions 1. BLOOD PRESSURE: "What is the blood pressure?" "Did you take at least two measurements 5 minutes apart?"     Concerns about BP fluctuating- high to low 2. ONSET: "When did you take your blood pressure?"     Yesterday- high- does not have reading 3. HOW: "How did you take your blood pressure?" (e.g., automatic home BP monitor, visiting nurse)     Home cuff 4. HISTORY: "Do you have a history of high blood pressure?"     Has medication from her country 5. MEDICINES: "Are you taking any medicines for blood pressure?" "Have you missed any doses recently?"     Yes- not sure name 6. OTHER SYMPTOMS: "Do you have any symptoms?" (e.g., blurred vision, chest pain, difficulty breathing, headache, weakness)     headache 7. PREGNANCY: "Is there any chance you are pregnant?" "When was your last menstrual period?"  Protocols used: Blood Pressure - High-A-AH

## 2021-09-23 NOTE — Telephone Encounter (Signed)
  Chief Complaint: fluctuating BP Symptoms: headache, high BP Frequency: on/off- patient is visiting from out of country- is not a resident. Son is wanting patient to be seen for her complaints and follow up from ED.  Pertinent Negatives: Patient denies   Disposition: [] ED /[] Urgent Care (no appt availability in office) / [] Appointment(In office/virtual)/ []  [facility]/ [] Home Care/ [] Refused Recommended Disposition /[] [facility]/ []  Follow-up with PCP Additional Notes: Advised UC/mobile unit- appointment provided with mobile unit provider- advised bring medications with her

## 2021-09-24 ENCOUNTER — Ambulatory Visit: Payer: Self-pay | Admitting: Physician Assistant
